# Patient Record
Sex: Female | Born: 1990 | Race: White | Hispanic: No | Marital: Married | Smoking: Never smoker
Health system: Southern US, Community
[De-identification: ages and names within clinical notes are randomized; demographics above are authoritative.]

## PROBLEM LIST (undated history)

## (undated) DIAGNOSIS — F319 Bipolar disorder, unspecified: Secondary | ICD-10-CM

## (undated) HISTORY — PX: WISDOM TOOTH EXTRACTION: SHX21

## (undated) HISTORY — DX: Bipolar disorder, unspecified: F31.9

---
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
## 2009-02-25 ENCOUNTER — Encounter: Admission: RE | Admit: 2009-02-25 | Discharge: 2009-02-25 | Payer: Self-pay | Admitting: Pediatrics

## 2009-05-06 ENCOUNTER — Ambulatory Visit: Payer: Self-pay | Admitting: Women's Health

## 2010-05-07 ENCOUNTER — Ambulatory Visit
Admission: RE | Admit: 2010-05-07 | Discharge: 2010-05-07 | Payer: Self-pay | Source: Home / Self Care | Attending: Women's Health | Admitting: Women's Health

## 2010-07-29 ENCOUNTER — Ambulatory Visit (INDEPENDENT_AMBULATORY_CARE_PROVIDER_SITE_OTHER): Payer: Private Health Insurance - Indemnity | Admitting: Women's Health

## 2010-07-29 DIAGNOSIS — R82998 Other abnormal findings in urine: Secondary | ICD-10-CM

## 2010-07-29 DIAGNOSIS — N390 Urinary tract infection, site not specified: Secondary | ICD-10-CM

## 2011-01-20 LAB — DIFFERENTIAL
Basophils Relative: 0
Eosinophils Absolute: 0
Lymphocytes Relative: 22 — ABNORMAL LOW
Lymphs Abs: 1.6
Neutro Abs: 5.3

## 2011-01-20 LAB — URINALYSIS, ROUTINE W REFLEX MICROSCOPIC
Bilirubin Urine: NEGATIVE
Hgb urine dipstick: NEGATIVE
Ketones, ur: >80 — AB
Protein, ur: NEGATIVE
Specific Gravity, Urine: 1.028

## 2011-01-20 LAB — I-STAT 8, (EC8 V) (CONVERTED LAB)
BUN: 8
Chloride: 106
Potassium: 4.4
Sodium: 137
pH, Ven: 7.385 — ABNORMAL HIGH

## 2011-01-20 LAB — RAPID URINE DRUG SCREEN, HOSP PERFORMED
Barbiturates: NOT DETECTED
Cocaine: NOT DETECTED
Opiates: NOT DETECTED

## 2011-01-20 LAB — CBC
MCV: 82.1
Platelets: 249
RBC: 4.47
RDW: 14.5

## 2011-01-20 LAB — POCT PREGNANCY, URINE
Operator id: 294501
Preg Test, Ur: NEGATIVE

## 2011-01-20 LAB — POCT I-STAT CREATININE: Operator id: 294501

## 2011-01-20 LAB — ETHANOL: Alcohol, Ethyl (B): <5

## 2011-05-09 ENCOUNTER — Encounter: Payer: Self-pay | Admitting: Women's Health

## 2011-05-09 ENCOUNTER — Ambulatory Visit (INDEPENDENT_AMBULATORY_CARE_PROVIDER_SITE_OTHER): Payer: Private Health Insurance - Indemnity | Admitting: Women's Health

## 2011-05-09 ENCOUNTER — Other Ambulatory Visit (HOSPITAL_COMMUNITY)
Admission: RE | Admit: 2011-05-09 | Discharge: 2011-05-09 | Disposition: A | Discharge status: Home / Self Care | Payer: Private Health Insurance - Indemnity | Source: Ambulatory Visit | Attending: Obstetrics and Gynecology | Admitting: Obstetrics and Gynecology

## 2011-05-09 VITALS — BP 102/74 | Ht 63.5 in | Wt 114.0 lb

## 2011-05-09 DIAGNOSIS — Z01419 Encounter for gynecological examination (general) (routine) without abnormal findings: Secondary | ICD-10-CM

## 2011-05-09 DIAGNOSIS — Z309 Encounter for contraceptive management, unspecified: Secondary | ICD-10-CM

## 2011-05-09 DIAGNOSIS — IMO0001 Reserved for inherently not codable concepts without codable children: Secondary | ICD-10-CM

## 2011-05-09 DIAGNOSIS — Z113 Encounter for screening for infections with a predominantly sexual mode of transmission: Secondary | ICD-10-CM

## 2011-05-09 DIAGNOSIS — E079 Disorder of thyroid, unspecified: Secondary | ICD-10-CM

## 2011-05-09 LAB — URINALYSIS
Glucose, UA: NEGATIVE mg/dL
Leukocytes, UA: NEGATIVE
Protein, ur: 100 mg/dL — AB
Urobilinogen, UA: 0.2 mg/dL (ref 0.0–1.0)
pH: 5.5 (ref 5.0–8.0)

## 2011-05-09 MED ORDER — NORGESTIM-ETH ESTRAD TRIPHASIC 0.18/0.215/0.25 MG-35 MCG PO TABS: 1.0000 | ORAL_TABLET | Freq: Every day | ORAL | Status: DC

## 2011-05-09 MED ORDER — NORGESTIMATE-ETH ESTRADIOL 0.25-35 MG-MCG PO TABS: 1.0000 | ORAL_TABLET | Freq: Every day | ORAL | Status: DC

## 2011-05-09 NOTE — Progress Notes (Signed)
Angela Robinson April 19, 1991 [id number]    History:    The patient presents for annual exam.  Monthly 7 day cycle on Ortho-Cyclen without complaint. New partner. Gardasil completed in 09. Bipolar does see a psychiatrist Dr. Tomasa Rand for medication and counseling.   Past medical history, past surgical history, family history and social history were all reviewed and documented in the EPIC chart.   ROS:  A  ROS was performed and pertinent positives and negatives are included in the history.  Exam:  Filed Vitals:   05/09/11 0903  BP: 102/74    General appearance:  Normal Head/Neck:  Normal, without cervical or supraclavicular adenopathy. Thyroid:  Symmetrical, normal in size, without palpable masses or nodularity. Respiratory  Effort:  Normal  Auscultation:  Clear without wheezing or rhonchi Cardiovascular  Auscultation:  Regular rate, without rubs, murmurs or gallops  Edema/varicosities:  Not grossly evident Abdominal  Soft,nontender, without masses, guarding or rebound.  Liver/spleen:  No organomegaly noted  Hernia:  None appreciated  Skin  Inspection:  Grossly normal  Palpation:  Grossly normal Neurologic/psychiatric  Orientation:  Normal with appropriate conversation.  Mood/affect:  Normal  Genitourinary    Breasts: Examined lying and sitting.     Right: Without masses, retractions, discharge or axillary adenopathy.     Left: Without masses, retractions, discharge or axillary adenopathy.   Inguinal/mons:  Normal without inguinal adenopathy  External genitalia:  Normal  BUS/Urethra/Skene's glands:  Normal  Bladder:  Normal  Vagina:  Normal  Cervix:  Normal  Uterus:  normal in size, shape and contour.  Midline and mobile  Adnexa/parametria:     Rt: Without masses or tenderness.   Lt: Without masses or tenderness.  Anus and perineum: Normal  Digital rectal exam:   Assessment/Plan:  21 y.o. SWF G0  for annual exam.    Normal GYN exam on Ortho-Cyclen STD  screen Bipolar/Dr. Tomasa Rand for meds and counseling  Plan: CBC, TSH, UA and Pap, GC/chlamydia declined HIV hepatitis or  RPR. SBEs, exercise, calcium rich diet, MVI daily encouraged. Ortho-Cyclen prescription, proper use, slight risk for blood clots and strokes reviewed. Condoms encouraged until permanent partner.    Harrington Challenger [facility], 9:35 AM 05/09/2011

## 2011-05-10 LAB — CBC WITH DIFFERENTIAL/PLATELET
Eosinophils Absolute: 0.2 10*3/uL (ref 0.0–0.7)
Eosinophils Relative: 4 % (ref 0–5)
Hemoglobin: 12.6 g/dL (ref 12.0–15.0)
Lymphs Abs: 2.2 10*3/uL (ref 0.7–4.0)
MCV: 92.1 fL (ref 78.0–100.0)
Monocytes Absolute: 0.5 10*3/uL (ref 0.1–1.0)
Monocytes Relative: 10 % (ref 3–12)
Neutro Abs: 1.8 10*3/uL (ref 1.7–7.7)
Platelets: 139 10*3/uL — ABNORMAL LOW (ref 150–400)
RBC: 4.28 MIL/uL (ref 3.87–5.11)
RDW: 12.8 % (ref 11.5–15.5)
WBC: 4.7 10*3/uL (ref 4.0–10.5)

## 2011-05-10 LAB — TSH: TSH: 7.831 u[IU]/mL — ABNORMAL HIGH (ref 0.350–4.500)

## 2011-05-11 ENCOUNTER — Telehealth: Payer: Self-pay | Admitting: *Deleted

## 2011-05-11 NOTE — Telephone Encounter (Signed)
Telephone call to Coward and her mother. Did review mild scoliosis. Reviewed importance of core exercises, good posture. Is having no problems at this time.

## 2011-05-11 NOTE — Telephone Encounter (Signed)
Pt mother called and would like to know what she can do about her daughter having scoliosis? Pt mother said you told her that Angela Robinson has this please advise

## 2011-05-12 ENCOUNTER — Other Ambulatory Visit: Payer: Self-pay | Admitting: *Deleted

## 2011-05-13 ENCOUNTER — Other Ambulatory Visit: Payer: Self-pay | Admitting: *Deleted

## 2011-05-16 ENCOUNTER — Other Ambulatory Visit: Payer: Self-pay | Admitting: *Deleted

## 2011-05-16 DIAGNOSIS — E0590 Thyrotoxicosis, unspecified without thyrotoxic crisis or storm: Secondary | ICD-10-CM

## 2011-05-16 DIAGNOSIS — D696 Thrombocytopenia, unspecified: Secondary | ICD-10-CM

## 2011-05-17 ENCOUNTER — Other Ambulatory Visit: Payer: Private Health Insurance - Indemnity

## 2011-05-17 DIAGNOSIS — D696 Thrombocytopenia, unspecified: Secondary | ICD-10-CM

## 2011-05-17 DIAGNOSIS — E0590 Thyrotoxicosis, unspecified without thyrotoxic crisis or storm: Secondary | ICD-10-CM

## 2011-05-18 LAB — THYROID PANEL WITH TSH
T3 Uptake: 31.1 % (ref 22.5–37.0)
T4, Total: 9.9 ug/dL (ref 5.0–12.5)
TSH: 5.295 u[IU]/mL — ABNORMAL HIGH (ref 0.350–4.500)

## 2011-05-18 LAB — CBC WITH DIFFERENTIAL/PLATELET
Eosinophils Absolute: 0.1 10*3/uL (ref 0.0–0.7)
Eosinophils Relative: 3 % (ref 0–5)
Monocytes Absolute: 0.4 10*3/uL (ref 0.1–1.0)
Neutro Abs: 1.5 10*3/uL — ABNORMAL LOW (ref 1.7–7.7)
WBC: 3.9 10*3/uL — ABNORMAL LOW (ref 4.0–10.5)

## 2011-05-26 ENCOUNTER — Telehealth: Payer: Self-pay | Admitting: Women's Health

## 2011-05-26 DIAGNOSIS — E039 Hypothyroidism, unspecified: Secondary | ICD-10-CM

## 2011-05-26 MED ORDER — LEVOTHYROXINE SODIUM 50 MCG PO TABS: 50.0000 ug | ORAL_TABLET | Freq: Every day | ORAL | Status: DC

## 2011-05-26 NOTE — Telephone Encounter (Signed)
TSH elevated at 5.29 and 7.81 with a normal T3 T4.  Reviewed needs Synthroid 50 daily, reviewed proper admin.  Rx called in and instructed to return in 6 weeks for TSH

## 2011-06-29 ENCOUNTER — Ambulatory Visit: Payer: Private Health Insurance - Indemnity

## 2011-06-29 ENCOUNTER — Other Ambulatory Visit: Payer: Private Health Insurance - Indemnity

## 2011-06-29 LAB — TSH: TSH: 2.366 u[IU]/mL (ref 0.350–4.500)

## 2011-07-01 ENCOUNTER — Other Ambulatory Visit: Payer: Self-pay | Admitting: *Deleted

## 2011-07-01 DIAGNOSIS — E039 Hypothyroidism, unspecified: Secondary | ICD-10-CM

## 2011-07-01 MED ORDER — LEVOTHYROXINE SODIUM 50 MCG PO TABS: 50.0000 ug | ORAL_TABLET | Freq: Every day | ORAL | Status: DC

## 2011-07-04 ENCOUNTER — Ambulatory Visit: Payer: Private Health Insurance - Indemnity

## 2011-09-30 ENCOUNTER — Emergency Department: Payer: Self-pay | Admitting: Emergency Medicine

## 2011-09-30 LAB — WET PREP, GENITAL

## 2011-09-30 LAB — COMPREHENSIVE METABOLIC PANEL
Albumin: 3 g/dL — ABNORMAL LOW (ref 3.4–5.0)
BUN: 14 mg/dL (ref 7–18)
Calcium, Total: 8.7 mg/dL (ref 8.5–10.1)
EGFR (African American): >60
Potassium: 3.5 mmol/L (ref 3.5–5.1)
Sodium: 140 mmol/L (ref 136–145)
Total Protein: 7 g/dL (ref 6.4–8.2)

## 2011-09-30 LAB — URINALYSIS, COMPLETE
Bacteria: NONE SEEN
Bilirubin,UR: NEGATIVE
Blood: NEGATIVE
Protein: NEGATIVE
RBC,UR: <1 /HPF (ref 0–5)
WBC UR: 1 /HPF (ref 0–5)

## 2011-09-30 LAB — CBC
HGB: 12.3 g/dL (ref 12.0–16.0)
MCH: 28.7 pg (ref 26.0–34.0)
MCHC: 32.5 g/dL (ref 32.0–36.0)
MCV: 88 fL (ref 80–100)
Platelet: 156 10*3/uL (ref 150–440)
RBC: 4.3 10*6/uL (ref 3.80–5.20)

## 2011-09-30 LAB — LIPASE, BLOOD: Lipase: 90 U/L (ref 73–393)

## 2011-09-30 LAB — PREGNANCY, URINE: Pregnancy Test, Urine: NEGATIVE m[IU]/mL

## 2011-10-03 ENCOUNTER — Encounter: Payer: Self-pay | Admitting: Women's Health

## 2011-10-03 ENCOUNTER — Ambulatory Visit (INDEPENDENT_AMBULATORY_CARE_PROVIDER_SITE_OTHER): Payer: Private Health Insurance - Indemnity | Admitting: Women's Health

## 2011-10-03 DIAGNOSIS — R102 Pelvic and perineal pain: Secondary | ICD-10-CM

## 2011-10-03 DIAGNOSIS — N949 Unspecified condition associated with female genital organs and menstrual cycle: Secondary | ICD-10-CM

## 2011-10-03 DIAGNOSIS — E039 Hypothyroidism, unspecified: Secondary | ICD-10-CM

## 2011-10-03 NOTE — Progress Notes (Signed)
Patient ID: Angela Robinson, female   DOB: [birth year], 21 y.o.   MRN: [id number] Presents for followup. Was seen at [facility] on 09/30/11 for pelvic pain. Had a normal CBC, UA, pelvic ultrasound, and pelvic CT scan. Was diagnosed with PID prescribed doxycycline 100 mg twice a day for 14 days, Zofran 4mg  when necessary, Motrin 800 when necessary for pain. At annual exam here she had negative gonorrhea/chlamydia cultures January 2013. Same partner, lives with partner. Contraceptives on Sprintec, no missed doses. Was instructed to followup due to a questionable cervical polyp on exam. Diagnosed with hypothyroidism in January 2013 has been on Synthroid 50 mcg daily. States also has problems with constipation.  Exam: Abdomen soft nontender, external genitalia within normal limits, speculum exam cervix red with no visible polyp noted. No discharge or odor noted. Minimal discomfort with exam.  Plan: TSH, triage based on results. Instructed to take doxycycline as prescribed, take after a meal, use Zofran as needed. Instructed to call [facility] for final lab results. Plan discussed with mother also. Several sample packs of MiraLax given with instructions. Instructed to increase fiber rich foods and fluids in diet.

## 2011-10-05 ENCOUNTER — Other Ambulatory Visit: Payer: Self-pay | Admitting: *Deleted

## 2011-10-05 ENCOUNTER — Telehealth: Payer: Self-pay | Admitting: *Deleted

## 2011-10-05 DIAGNOSIS — E039 Hypothyroidism, unspecified: Secondary | ICD-10-CM

## 2011-10-05 MED ORDER — LEVOTHYROXINE SODIUM 50 MCG PO TABS: 50.0000 ug | ORAL_TABLET | Freq: Every day | ORAL | Status: DC

## 2011-10-05 NOTE — Telephone Encounter (Signed)
Pt mother would like you to call Angela Robinson [phone] pt has been sick while taking doxycycline Rx, I asked mother has pt taken zofran, mother said she wasn't sure. I spoke with mother about tsh result note and synthroid 50 mg has been sent to pharmacy.

## 2011-10-05 NOTE — Telephone Encounter (Signed)
Telephone call to review problem. States took doxycycline yesterday without vomiting. Took this morning without Zofran vomited x1. Instructed to take Zofran 30 minutes prior to the doxycycline this evening and call if continued problems with nausea and vomiting. Reviewed importance of Synthroid. Instructed to take daily return to office in 6 weeks for recheck of TSH.

## 2011-11-17 ENCOUNTER — Encounter: Payer: Self-pay | Admitting: Gynecology

## 2011-11-17 ENCOUNTER — Ambulatory Visit (INDEPENDENT_AMBULATORY_CARE_PROVIDER_SITE_OTHER): Payer: Private Health Insurance - Indemnity | Admitting: Gynecology

## 2011-11-17 VITALS — BP 116/72

## 2011-11-17 DIAGNOSIS — N898 Other specified noninflammatory disorders of vagina: Secondary | ICD-10-CM

## 2011-11-17 DIAGNOSIS — Z8742 Personal history of other diseases of the female genital tract: Secondary | ICD-10-CM

## 2011-11-17 DIAGNOSIS — N760 Acute vaginitis: Secondary | ICD-10-CM

## 2011-11-17 DIAGNOSIS — Z113 Encounter for screening for infections with a predominantly sexual mode of transmission: Secondary | ICD-10-CM

## 2011-11-17 DIAGNOSIS — B373 Candidiasis of vulva and vagina: Secondary | ICD-10-CM

## 2011-11-17 LAB — WET PREP FOR TRICH, YEAST, CLUE: Clue Cells Wet Prep HPF POC: NONE SEEN

## 2011-11-17 MED ORDER — FLUCONAZOLE 150 MG PO TABS: 150.0000 mg | ORAL_TABLET | Freq: Once | ORAL | Status: DC

## 2011-11-17 MED ORDER — FLUCONAZOLE 100 MG PO TABS: 100.0000 mg | ORAL_TABLET | Freq: Every day | ORAL | Status: DC

## 2011-11-17 NOTE — Addendum Note (Signed)
Addended by: Dara Lords on: 11/17/2011 05:58 PM   Modules accepted: Orders

## 2011-11-17 NOTE — Progress Notes (Signed)
Patient 21 year old who presented to the office complaining of vulvovaginal irritation pruritus. She stated that in June of this year she was seen at [facility] and was treated for PID and received an injection and was given antibiotics to take for a week to 10 days post discharge. Her cycles are otherwise regular she's a monogamous relationship and she is on oral contraceptive pills. She stated she was having a white-like discharge as well. Patient has no prior history of STD prior to visit in June. She stated she's never had GC and chlamydia in the past.  Exam: Bartholin urethra Skene were: External genital irritation and erythema and some superficial mucosal cuts. Vagina: thick white discharge Cervix: No lesions or discharge  Wet prep moderate amount of yeast and white blood cells and bacteria.  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 Patient will be placed on Diflucan 150 mg once a day and to repeat in 3 days. She was given an extra tablet in case it was still persistent after that. GC and Chlamydia culture was obtained results pending at time of this dictation.

## 2011-11-17 NOTE — Patient Instructions (Signed)
Monilial Vaginitis Vaginitis in a soreness, swelling and redness (inflammation) of the vagina and vulva. Monilial vaginitis is not a sexually transmitted infection. CAUSES  Yeast vaginitis is caused by yeast (candida) that is normally found in your vagina. With a yeast infection, the candida has overgrown in number to a point that upsets the chemical balance. SYMPTOMS   White, thick vaginal discharge.   Swelling, itching, redness and irritation of the vagina and possibly the lips of the vagina (vulva).   Burning or painful urination.   Painful intercourse.  DIAGNOSIS  Things that may contribute to monilial vaginitis are:  Postmenopausal and virginal states.   Pregnancy.   Infections.   Being tired, sick or stressed, especially if you had monilial vaginitis in the past.   Diabetes. Good control will help lower the chance.   Birth control pills.   Tight fitting garments.   Using bubble bath, feminine sprays, douches or deodorant tampons.   Taking certain medications that kill germs (antibiotics).   Sporadic recurrence can occur if you become ill.  TREATMENT  Your caregiver will give you medication.  There are several kinds of anti monilial vaginal creams and suppositories specific for monilial vaginitis. For recurrent yeast infections, use a suppository or cream in the vagina 2 times a week, or as directed.   Anti-monilial or steroid cream for the itching or irritation of the vulva may also be used. Get your caregiver's permission.   Painting the vagina with methylene blue solution may help if the monilial cream does not work.   Eating yogurt may help prevent monilial vaginitis.  HOME CARE INSTRUCTIONS   Finish all medication as prescribed.   Do not have sex until treatment is completed or after your caregiver tells you it is okay.   Take warm sitz baths.   Do not douche.   Do not use tampons, especially scented ones.   Wear cotton underwear.   Avoid tight  pants and panty hose.   Tell your sexual partner that you have a yeast infection. They should go to their caregiver if they have symptoms such as mild rash or itching.   Your sexual partner should be treated as well if your infection is difficult to eliminate.   Practice safer sex. Use condoms.   Some vaginal medications cause latex condoms to fail. Vaginal medications that harm condoms are:   Cleocin cream.   Butoconazole (Femstat).   Terconazole (Terazol) vaginal suppository.   Miconazole (Monistat) (may be purchased over the counter).  SEEK MEDICAL CARE IF:   You have a temperature by mouth above 102 F (38.9 C).   The infection is getting worse after 2 days of treatment.   The infection is not getting better after 3 days of treatment.   You develop blisters in or around your vagina.   You develop vaginal bleeding, and it is not your menstrual period.   You have pain when you urinate.   You develop intestinal problems.   You have pain with sexual intercourse.  Document Released: 01/26/2005 Document Revised: 04/07/2011 Document Reviewed: 10/10/2008 ExitCare Patient Information 2012 [employer]. 

## 2011-11-18 ENCOUNTER — Other Ambulatory Visit: Payer: Self-pay | Admitting: Gynecology

## 2011-11-18 LAB — GC/CHLAMYDIA PROBE AMP, GENITAL
Chlamydia, DNA Probe: NEGATIVE
GC Probe Amp, Genital: NEGATIVE

## 2011-11-18 MED ORDER — FLUCONAZOLE 150 MG PO TABS: ORAL_TABLET | ORAL | Status: DC

## 2011-11-21 ENCOUNTER — Ambulatory Visit: Payer: Private Health Insurance - Indemnity | Admitting: Women's Health

## 2011-11-29 ENCOUNTER — Telehealth: Payer: Self-pay | Admitting: *Deleted

## 2011-11-29 DIAGNOSIS — E039 Hypothyroidism, unspecified: Secondary | ICD-10-CM

## 2011-11-29 MED ORDER — LEVOTHYROXINE SODIUM 50 MCG PO TABS: 50.0000 ug | ORAL_TABLET | Freq: Every day | ORAL | Status: DC

## 2011-11-29 NOTE — Telephone Encounter (Signed)
Mother called to say dog ate the rest of the patients TSH meds. Mother wanted to get another refill said patient had missed pills for a few days.  Told mother we would call in refill and let patient take for a couple weeks then come back for her TSH check then.

## 2011-11-30 ENCOUNTER — Other Ambulatory Visit: Payer: Private Health Insurance - Indemnity

## 2012-02-10 ENCOUNTER — Other Ambulatory Visit: Payer: Self-pay | Admitting: Women's Health

## 2012-02-10 NOTE — Telephone Encounter (Signed)
Left message for patient that [place] refilled her medication but I need her to call me to speak with her about arranging to have her TSH re-checked.

## 2012-02-10 NOTE — Telephone Encounter (Signed)
Please call [name] and have her get a TSH checked also, did approve the refill.

## 2012-02-13 NOTE — Telephone Encounter (Signed)
Left message to call to arrange to have her thyroid rechecked.

## 2012-05-09 ENCOUNTER — Ambulatory Visit (INDEPENDENT_AMBULATORY_CARE_PROVIDER_SITE_OTHER): Payer: Private Health Insurance - Indemnity | Admitting: Women's Health

## 2012-05-09 ENCOUNTER — Encounter: Payer: Self-pay | Admitting: Women's Health

## 2012-05-09 VITALS — BP 112/64 | Ht 64.0 in | Wt 115.0 lb

## 2012-05-09 DIAGNOSIS — Z01419 Encounter for gynecological examination (general) (routine) without abnormal findings: Secondary | ICD-10-CM

## 2012-05-09 DIAGNOSIS — E039 Hypothyroidism, unspecified: Secondary | ICD-10-CM

## 2012-05-09 LAB — CBC WITH DIFFERENTIAL/PLATELET
Basophils Relative: 0 % (ref 0–1)
HCT: 35.9 % — ABNORMAL LOW (ref 36.0–46.0)
Hemoglobin: 12.3 g/dL (ref 12.0–15.0)
Lymphocytes Relative: 19 % (ref 12–46)
Lymphs Abs: 0.9 10*3/uL (ref 0.7–4.0)
MCHC: 34.3 g/dL (ref 30.0–36.0)
Monocytes Absolute: 0.4 10*3/uL (ref 0.1–1.0)
Monocytes Relative: 9 % (ref 3–12)
Neutro Abs: 3.5 10*3/uL (ref 1.7–7.7)
Neutrophils Relative %: 72 % (ref 43–77)
RBC: 4.12 MIL/uL (ref 3.87–5.11)

## 2012-05-09 LAB — TSH: TSH: 0.893 u[IU]/mL (ref 0.350–4.500)

## 2012-05-09 MED ORDER — NORGESTIMATE-ETH ESTRADIOL 0.25-35 MG-MCG PO TABS: 1.0000 | ORAL_TABLET | Freq: Every day | ORAL | Status: DC

## 2012-05-09 MED ORDER — LEVOTHYROXINE SODIUM 50 MCG PO TABS: 50.0000 ug | ORAL_TABLET | Freq: Every day | ORAL | Status: DC

## 2012-05-09 NOTE — Progress Notes (Signed)
Angela Robinson 05-13-1990 [id number]    History:    The patient presents for annual exam.  Regular monthly cycle on Ortho-Cyclen without complaint. Gardasil series completed in 2009. Paps normal 2012. Hypothyroid on Synthroid 50 mcg. Reports not taking for about 2 weeks/ has been sick with a cold. Bipolar, Dr. Tomasa Rand manages with meds.   Past medical history, past surgical history, family history and social history were all reviewed and documented in the EPIC chart. 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   ROS:  A  ROS was performed and pertinent positives and negatives are included in the history.  Exam:  Filed Vitals:   05/09/12 0804  BP: 112/64    General appearance:  Normal Head/Neck:  Normal, without cervical or supraclavicular adenopathy. Thyroid:  Symmetrical, normal in size, without palpable masses or nodularity. Respiratory  Effort:  Normal  Auscultation:  Clear without wheezing or rhonchi Cardiovascular  Auscultation:  Regular rate, without rubs, murmurs or gallops  Edema/varicosities:  Not grossly evident Abdominal  Soft,nontender, without masses, guarding or rebound.  Liver/spleen:  No organomegaly noted  Hernia:  None appreciated  Skin  Inspection:  Grossly normal  Palpation:  Grossly normal Neurologic/psychiatric  Orientation:  Normal with appropriate conversation.  Mood/affect:  Normal  Genitourinary    Breasts: Examined lying and sitting.     Right: Without masses, retractions, discharge or axillary adenopathy.     Left: Without masses, retractions, discharge or axillary adenopathy.   Inguinal/mons:  Normal without inguinal adenopathy  External genitalia:  Normal  BUS/Urethra/Skene's glands:  Normal  Bladder:  Normal  Vagina:  Normal  Cervix:  Normal  Uterus:  normal in size, shape and contour.  Midline and mobile  Adnexa/parametria:     Rt: Without masses or tenderness.   Lt: Without masses or tenderness.  Anus and  perineum: Normal   Assessment/Plan:  22 y.o. [name] for annual exam.     Normal GYN exam/Ortho-Cyclen Bipolar Dr. Tomasa Rand manages meds and counseling Hypothyroid/ Synthroid 50 mcg  Plan: Ortho-Cyclen prescription, proper use, slight risk for blood clots and strokes reviewed. SBE's, exercise, calcium rich diet, MVI daily encouraged. Synthroid 50 mcg by mouth daily, reviewed importance of taking daily. CBC, TSH, UA, Pap normal 2012, new screening guidelines reviewed.    Angela Robinson WHNP, 8:30 AM 05/09/2012

## 2012-05-09 NOTE — Patient Instructions (Addendum)

## 2012-05-10 LAB — URINALYSIS W MICROSCOPIC + REFLEX CULTURE
Bacteria, UA: NONE SEEN
Crystals: NONE SEEN
Leukocytes, UA: NEGATIVE
Protein, ur: 30 mg/dL — AB
Specific Gravity, Urine: 1.028 (ref 1.005–1.030)
pH: 6.5 (ref 5.0–8.0)

## 2012-07-24 ENCOUNTER — Telehealth: Payer: Self-pay

## 2012-07-24 NOTE — Telephone Encounter (Signed)
Pharmacy sent note stating that their supplier of Levothyroxin has changed from [employer] brand to [employer] brand.  They wanted to let you know and ask if okay to make this switch for patient.

## 2012-07-25 NOTE — Telephone Encounter (Signed)
Pharmacy informed.

## 2012-07-25 NOTE — Telephone Encounter (Signed)
Okay for change? °

## 2012-07-25 NOTE — Telephone Encounter (Signed)
Patient has been on the generic all along. They are just wanting you to know their manufacturer has changed to [employer].  Do you still want me to pursue the brand issue with her?

## 2012-07-25 NOTE — Telephone Encounter (Signed)
Branded  name is preferable if patient can afford. Can they order the other brand from another [facility]?  If not okay.

## 2012-12-19 ENCOUNTER — Ambulatory Visit (INDEPENDENT_AMBULATORY_CARE_PROVIDER_SITE_OTHER): Payer: Managed Care, Other (non HMO) | Admitting: Women's Health

## 2012-12-19 ENCOUNTER — Encounter: Payer: Self-pay | Admitting: Women's Health

## 2012-12-19 DIAGNOSIS — N898 Other specified noninflammatory disorders of vagina: Secondary | ICD-10-CM

## 2012-12-19 DIAGNOSIS — B379 Candidiasis, unspecified: Secondary | ICD-10-CM

## 2012-12-19 DIAGNOSIS — E039 Hypothyroidism, unspecified: Secondary | ICD-10-CM

## 2012-12-19 LAB — WET PREP FOR TRICH, YEAST, CLUE
Clue Cells Wet Prep HPF POC: NONE SEEN
Trich, Wet Prep: NONE SEEN
WBC, Wet Prep HPF POC: NONE SEEN

## 2012-12-19 MED ORDER — FLUCONAZOLE 150 MG PO TABS: ORAL_TABLET | ORAL | Status: DC

## 2012-12-19 NOTE — Progress Notes (Signed)
Patient ID: Angela Robinson, female   DOB: February 28, 1991, 22 y.o.   MRN: [id number] Presents with complaint of dyspareunia for several months with mild vaginal irritation, no noted discharge, some abdominal discomfort. Symptoms are vague, difficulty articulating discomfort. Denies urinary symptoms, fever or change in GI elimination. Same partner years.  Monthly cycle on Ortho-Cyclen. Hyperthyroid on 50 mcg of Synthroid.  Exam: Appears well. External genitalia erythematous at introitus, speculum exam vaginal walls are erythematous with white discharge, wet prep positive for moderate yeast. Bimanual no CMT or adnexal fullness or tenderness pain mostly at introitus and vaginal area.  Dyspareunia Yeast vaginitis Hypothyroid  Plan: Diflucan 150 by mouth today repeat in 3 days if needed. Prescription, proper use, yeast prevention discussed.  Instructed to call if no relief of dyspareunia. TSH pending.

## 2012-12-19 NOTE — Addendum Note (Signed)
Addended by: Richardson Chiquito on: 12/19/2012 03:57 PM   Modules accepted: Orders

## 2012-12-19 NOTE — Patient Instructions (Addendum)
Monilial Vaginitis  Vaginitis in a soreness, swelling and redness (inflammation) of the vagina and vulva. Monilial vaginitis is not a sexually transmitted infection.  CAUSES   Yeast vaginitis is caused by yeast (candida) that is normally found in your vagina. With a yeast infection, the candida has overgrown in number to a point that upsets the chemical balance.  SYMPTOMS   · White, thick vaginal discharge.  · Swelling, itching, redness and irritation of the vagina and possibly the lips of the vagina (vulva).  · Burning or painful urination.  · Painful intercourse.  DIAGNOSIS   Things that may contribute to monilial vaginitis are:  · Postmenopausal and virginal states.  · Pregnancy.  · Infections.  · Being tired, sick or stressed, especially if you had monilial vaginitis in the past.  · Diabetes. Good control will help lower the chance.  · Birth control pills.  · Tight fitting garments.  · Using bubble bath, feminine sprays, douches or deodorant tampons.  · Taking certain medications that kill germs (antibiotics).  · Sporadic recurrence can occur if you become ill.  TREATMENT   Your caregiver will give you medication.  · There are several kinds of anti monilial vaginal creams and suppositories specific for monilial vaginitis. For recurrent yeast infections, use a suppository or cream in the vagina 2 times a week, or as directed.  · Anti-monilial or steroid cream for the itching or irritation of the vulva may also be used. Get your caregiver's permission.  · Painting the vagina with methylene blue solution may help if the monilial cream does not work.  · Eating yogurt may help prevent monilial vaginitis.  HOME CARE INSTRUCTIONS   · Finish all medication as prescribed.  · Do not have sex until treatment is completed or after your caregiver tells you it is okay.  · Take warm sitz baths.  · Do not douche.  · Do not use tampons, especially scented ones.  · Wear cotton underwear.  · Avoid tight pants and panty  hose.  · Tell your sexual partner that you have a yeast infection. They should go to their caregiver if they have symptoms such as mild rash or itching.  · Your sexual partner should be treated as well if your infection is difficult to eliminate.  · Practice safer sex. Use condoms.  · Some vaginal medications cause latex condoms to fail. Vaginal medications that harm condoms are:  · Cleocin cream.  · Butoconazole (Femstat®).  · Terconazole (Terazol®) vaginal suppository.  · Miconazole (Monistat®) (may be purchased over the counter).  SEEK MEDICAL CARE IF:   · You have a temperature by mouth above 102° F (38.9° C).  · The infection is getting worse after 2 days of treatment.  · The infection is not getting better after 3 days of treatment.  · You develop blisters in or around your vagina.  · You develop vaginal bleeding, and it is not your menstrual period.  · You have pain when you urinate.  · You develop intestinal problems.  · You have pain with sexual intercourse.  Document Released: 01/26/2005 Document Revised: 07/11/2011 Document Reviewed: 10/10/2008  ExitCare® Patient Information ©2014 [employer].

## 2012-12-20 LAB — TSH: TSH: 3.05 u[IU]/mL (ref 0.350–4.500)

## 2013-05-10 ENCOUNTER — Encounter: Payer: Managed Care, Other (non HMO) | Admitting: Women's Health

## 2013-05-22 ENCOUNTER — Ambulatory Visit (INDEPENDENT_AMBULATORY_CARE_PROVIDER_SITE_OTHER): Payer: Managed Care, Other (non HMO) | Admitting: Women's Health

## 2013-05-22 ENCOUNTER — Encounter: Payer: Self-pay | Admitting: Women's Health

## 2013-05-22 VITALS — BP 110/70 | Ht 64.0 in | Wt 125.0 lb

## 2013-05-22 DIAGNOSIS — E039 Hypothyroidism, unspecified: Secondary | ICD-10-CM

## 2013-05-22 DIAGNOSIS — Z01419 Encounter for gynecological examination (general) (routine) without abnormal findings: Secondary | ICD-10-CM

## 2013-05-22 DIAGNOSIS — Z309 Encounter for contraceptive management, unspecified: Secondary | ICD-10-CM

## 2013-05-22 DIAGNOSIS — IMO0001 Reserved for inherently not codable concepts without codable children: Secondary | ICD-10-CM

## 2013-05-22 LAB — CBC WITH DIFFERENTIAL/PLATELET
BASOS PCT: 1 % (ref 0–1)
Basophils Absolute: 0 10*3/uL (ref 0.0–0.1)
EOS ABS: 0.1 10*3/uL (ref 0.0–0.7)
Eosinophils Relative: 2 % (ref 0–5)
HCT: 38.3 % (ref 36.0–46.0)
HEMOGLOBIN: 12.6 g/dL (ref 12.0–15.0)
Lymphocytes Relative: 54 % — ABNORMAL HIGH (ref 12–46)
Lymphs Abs: 2.3 10*3/uL (ref 0.7–4.0)
MCH: 29.9 pg (ref 26.0–34.0)
MCHC: 32.9 g/dL (ref 30.0–36.0)
MCV: 90.8 fL (ref 78.0–100.0)
MONOS PCT: 10 % (ref 3–12)
Monocytes Absolute: 0.4 10*3/uL (ref 0.1–1.0)
NEUTROS PCT: 33 % — AB (ref 43–77)
Neutro Abs: 1.5 10*3/uL — ABNORMAL LOW (ref 1.7–7.7)
Platelets: 182 10*3/uL (ref 150–400)
RBC: 4.22 MIL/uL (ref 3.87–5.11)
RDW: 13.5 % (ref 11.5–15.5)
WBC: 4.3 10*3/uL (ref 4.0–10.5)

## 2013-05-22 LAB — TSH: TSH: 5.842 u[IU]/mL — ABNORMAL HIGH (ref 0.350–4.500)

## 2013-05-22 MED ORDER — LEVOTHYROXINE SODIUM 50 MCG PO TABS: 50.0000 ug | ORAL_TABLET | Freq: Every day | ORAL | Status: DC

## 2013-05-22 MED ORDER — NORGESTIMATE-ETH ESTRADIOL 0.25-35 MG-MCG PO TABS: 1.0000 | ORAL_TABLET | Freq: Every day | ORAL | Status: DC

## 2013-05-22 NOTE — Patient Instructions (Signed)

## 2013-05-22 NOTE — Progress Notes (Signed)
Angela Robinson 05/23/1990 [id number]   History:    Presents for annual exam without complaint.  Monthly cycle/ Ortho-Cyclen/not sexually active. Gardasil series completed in 2009. History of normal paps. History of hypothyroidism/Synthroid 50 mcg. History of bipolar, Dr. Tomasa Robinson manages with meds.  Past medical history, past surgical history, family history and social history were all reviewed and documented in the EPIC chart. Works as a Child psychotherapistwaitress, lives at home. PGF, PGM heart disease and hyperlipidemia. MGM breast cancer in the 60s, DM. MGF DM  ROS:  A  ROS was performed and pertinent positives and negatives are included.  Exam:  Filed Vitals:   05/22/13 0806  BP: 110/70    General appearance:  Normal Thyroid:  Symmetrical, normal in size, without palpable masses or nodularity. Respiratory  Auscultation:  Clear without wheezing or rhonchi Cardiovascular  Auscultation:  Regular rate, without rubs, murmurs or gallops  Edema/varicosities:  Not grossly evident Abdominal  Soft,nontender, without masses, guarding or rebound.  Liver/spleen:  No organomegaly noted  Hernia:  None appreciated  Skin  Inspection:  Grossly normal   Breasts: Examined lying and sitting.     Right: Without masses, retractions, discharge or axillary adenopathy.     Left: Without masses, retractions, discharge or axillary adenopathy. Gentitourinary   Inguinal/mons:  Normal without inguinal adenopathy  External genitalia:  Normal  BUS/Urethra/Skene's glands:  Normal  Vagina:  Normal  Cervix:  Normal  Uterus:  Normal in size, shape and contour.  Midline and mobile  Adnexa/parametria:     Rt: Without masses or tenderness.   Lt: Without masses or tenderness.  Anus and perineum: Normal  Digital rectal exam: Normal sphincter tone without palpated masses or tenderness  Assessment/Plan:  23 y.o. SWF G0 for annual exam without complaint.  Normal GYN exam/Ortho-Cyclen Hypothyroid/ Synthroid 50 mcg Bipolar  Dr. Tomasa Robinson manages meds and counseling  Plan: Ortho-Cyclen prescription, slight risk for blood clots and strokes reviewed. Synthroid 50 mcg, once daily, prescription proper use given and reviewed. CBC, TSH, UA. Pap normal 2013, new screening guidelines reviewed.SBE's, controlling calories (10 pound weight gain from last year) exercise, calcium rich diet, MVI daily encouraged. Safe driving, dating reviewed. Condoms encouraged with new partners.    Angela Robinson [facility] JerseyWHNP, 8:35 AM 05/22/2013

## 2013-05-23 LAB — URINALYSIS W MICROSCOPIC + REFLEX CULTURE
BILIRUBIN URINE: NEGATIVE
Bacteria, UA: NONE SEEN
CRYSTALS: NONE SEEN
Casts: NONE SEEN
GLUCOSE, UA: NEGATIVE mg/dL
Hgb urine dipstick: NEGATIVE
Leukocytes, UA: NEGATIVE
Nitrite: NEGATIVE
PH: 7 (ref 5.0–8.0)
Protein, ur: NEGATIVE mg/dL
SPECIFIC GRAVITY, URINE: 1.028 (ref 1.005–1.030)
Urobilinogen, UA: 0.2 mg/dL (ref 0.0–1.0)

## 2013-05-29 ENCOUNTER — Other Ambulatory Visit: Payer: Self-pay | Admitting: Women's Health

## 2013-05-29 DIAGNOSIS — R7989 Other specified abnormal findings of blood chemistry: Secondary | ICD-10-CM

## 2014-04-15 IMAGING — CT CT ABD-PELV W/ CM
1 of 2 series · 15 of 32 positions shown, 19 images · non-contrast
Comparison: none

REASON FOR EXAM: (1) Abd pain, vomiting; (2) Abd pain, vomiting, no PO
contrast please
COMMENTS:

PROCEDURE:     CT  - CT ABDOMEN / PELVIS  W  - September 30, 2011  [DATE]
RESULT:     History: Pain and vomiting.
Impression study: No recent.

[Series 2: 3mm soft tissue · axial · 0.59mm/px · z∈[-653,-257]mm · 15 of 144 slices shown, 19 images]
[im 6/144  soft-tissue]
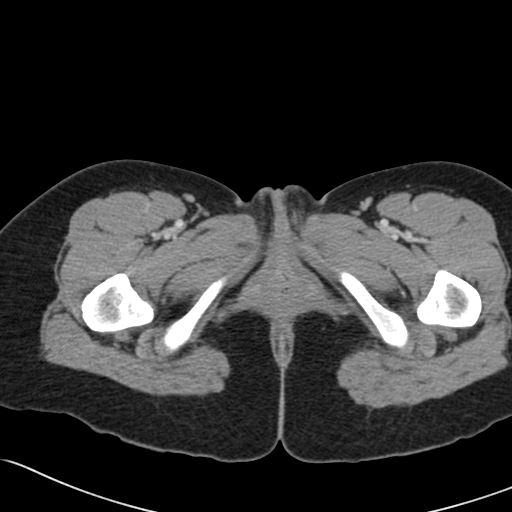
[im 6/144  bone]
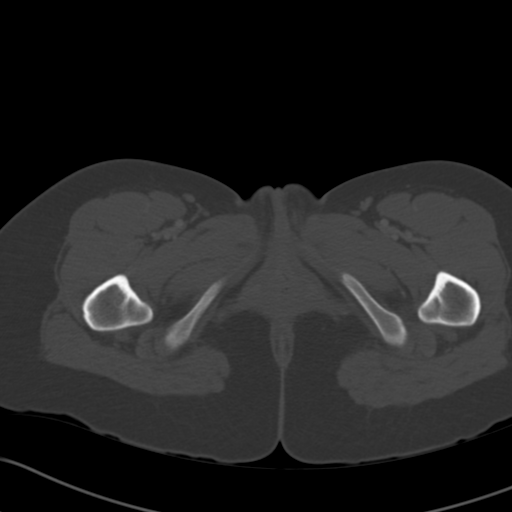
[im 18/144  soft-tissue]
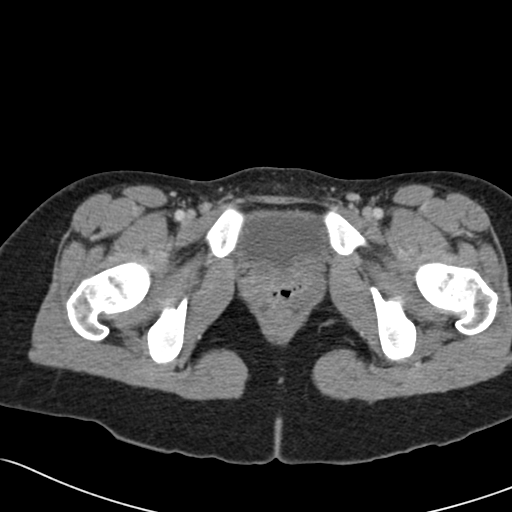
[im 29/144  soft-tissue]
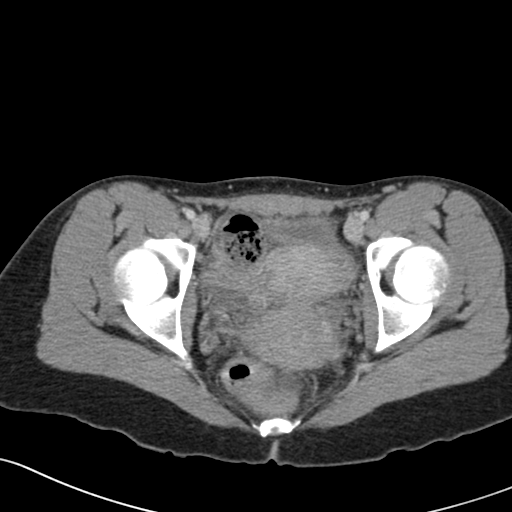
[im 41/144  soft-tissue]
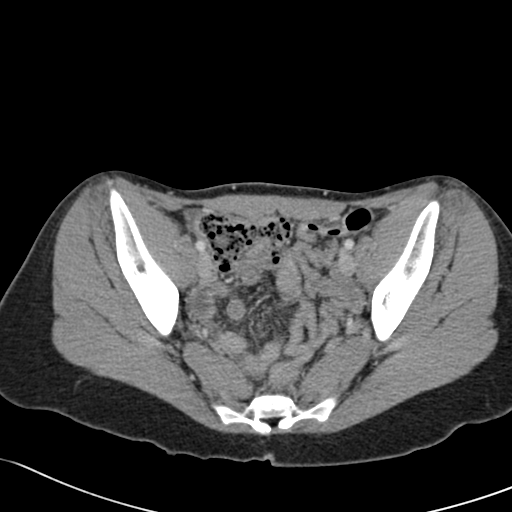
[im 52/144  soft-tissue]
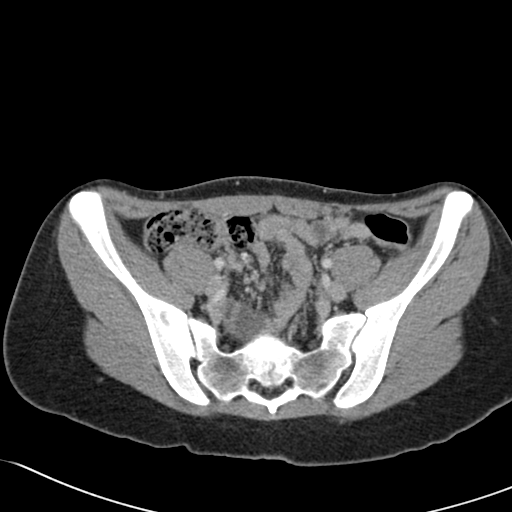
[im 63/144  soft-tissue]
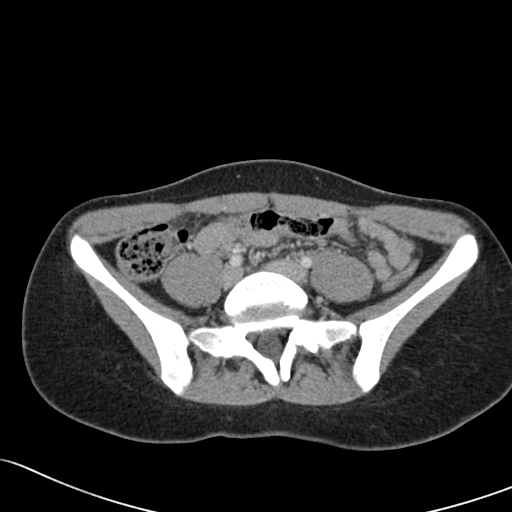
[im 75/144  soft-tissue]
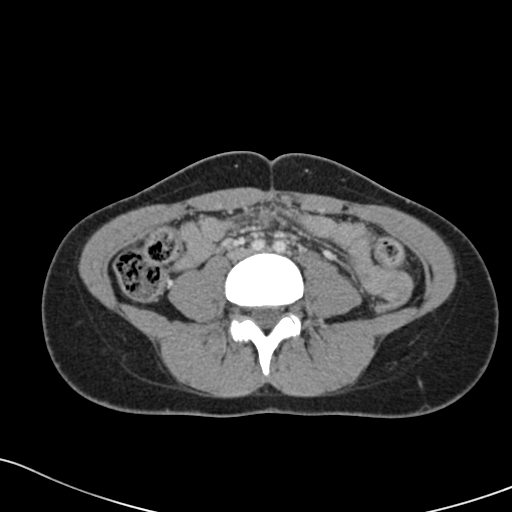
[im 81/144  soft-tissue]
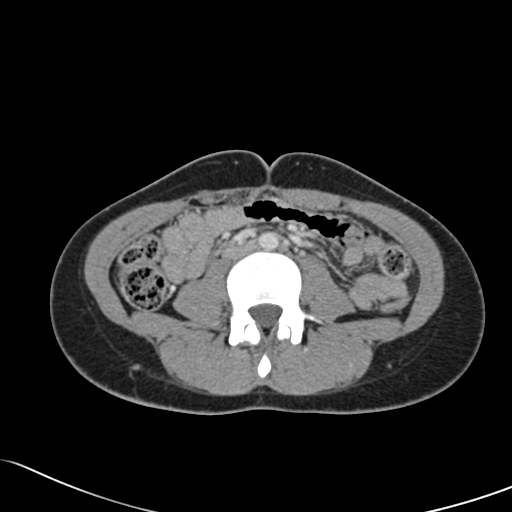
[im 92/144  soft-tissue]
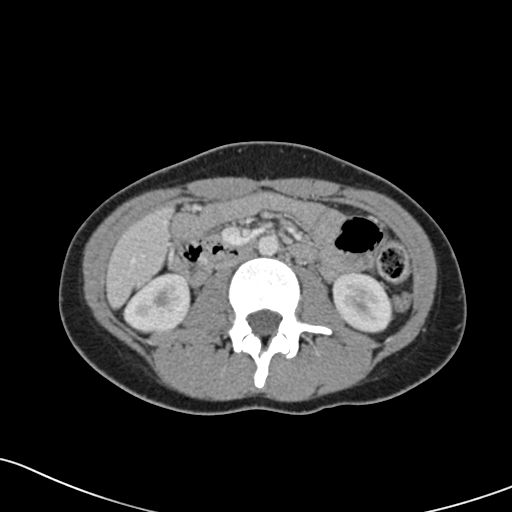
[im 92/144  bone]
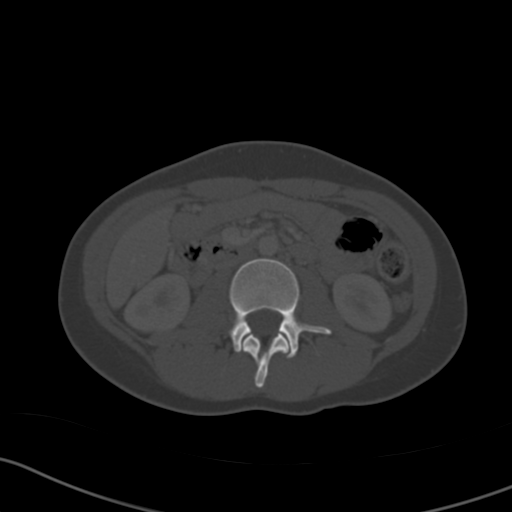
[im 103/144  soft-tissue]
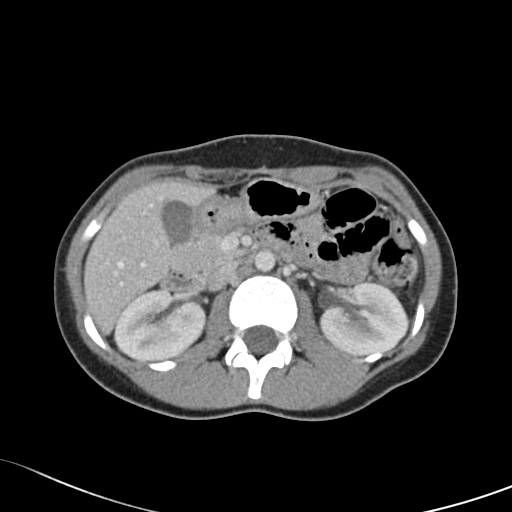
[im 115/144  soft-tissue]
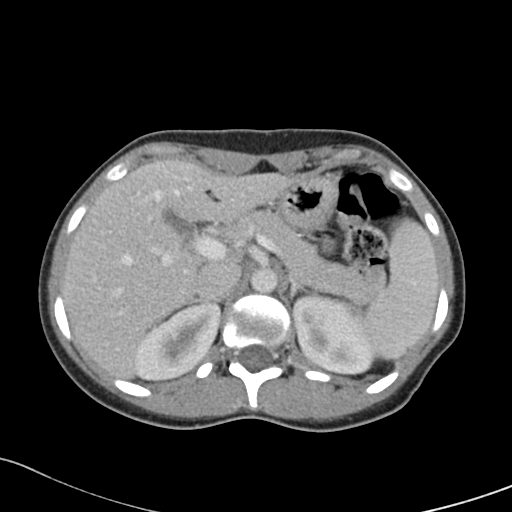
[im 121/144  lung]
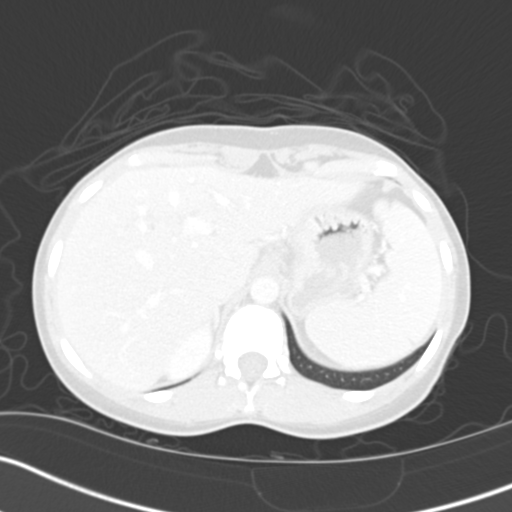
[im 126/144  soft-tissue]
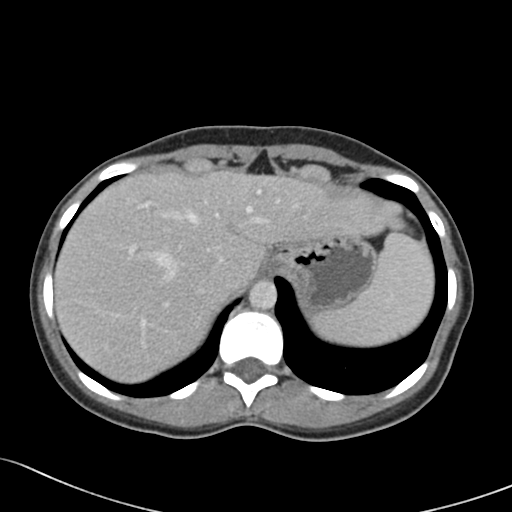
[im 126/144  lung]
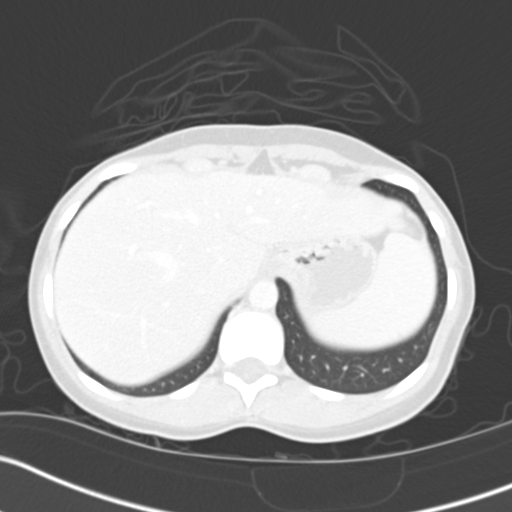
[im 132/144  lung]
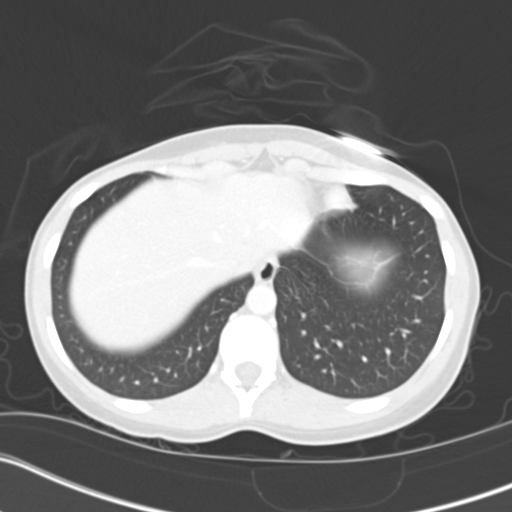
[im 138/144  soft-tissue]
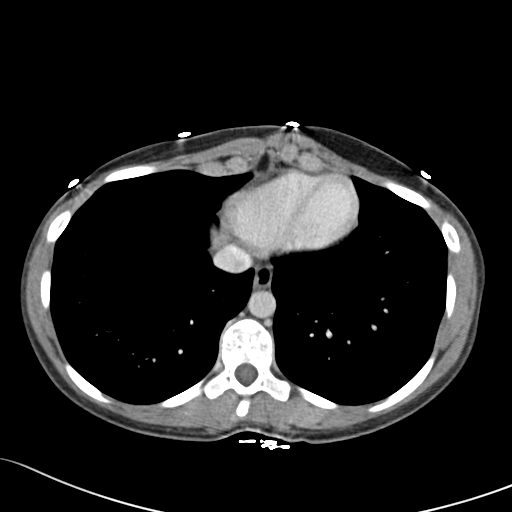
[im 138/144  lung]
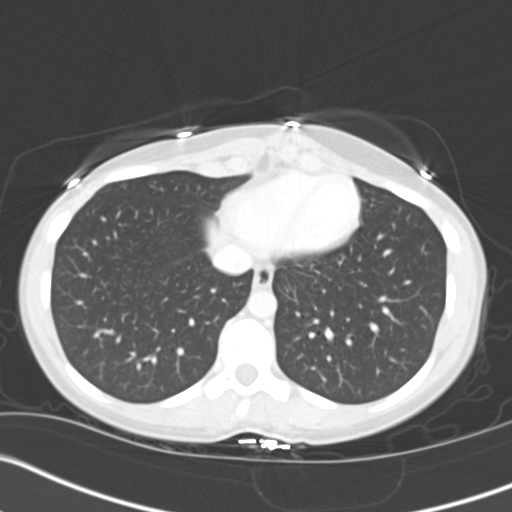

[15 of 32 positions shown; findings below may reference images not displayed]

FINDINGS: Standard CT obtained with 85 cc of Ksovue-JZZ. Lung bases clear.
No free air. Liver normal. Spleen normal. Pancreas normal. Adrenals normal.
Kidneys normal. Aorta nondistended. Free fluid is noted in the pelvis.
Cystic changes noted in the region of the ovaries. Appendix not definitely
visualized due to the fluid in the pelvis. No evidence of bowel obstruction.
Kidneys are normal. Aorta nondistended. Small amount of air noted in the
bladder.
IMPRESSION: Mild to moderate amount of free fluid in the pelvis. Cystic
changes and ovaries. Pregnancy test should be considered to exclude ectopic
pregnancy. Small amount of air noted in bladder. This could be from
instrumentation or infection.

## 2014-04-15 IMAGING — US TRANSABDOMINAL ULTRASOUND OF PELVIS
1 series · 14 of 25 positions shown · non-contrast
Comparison: none

REASON FOR EXAM: pain - left abd, adenxa
COMMENTS:

PROCEDURE:     US  - US PELVIS EXAM  - September 30, 2011  [DATE]
RESULT:     History: Pain.

[Series 1: transabdominal ultrasound of pelvis · 0.25mm/px · 14 of 45 slices shown]
[im 1/45]
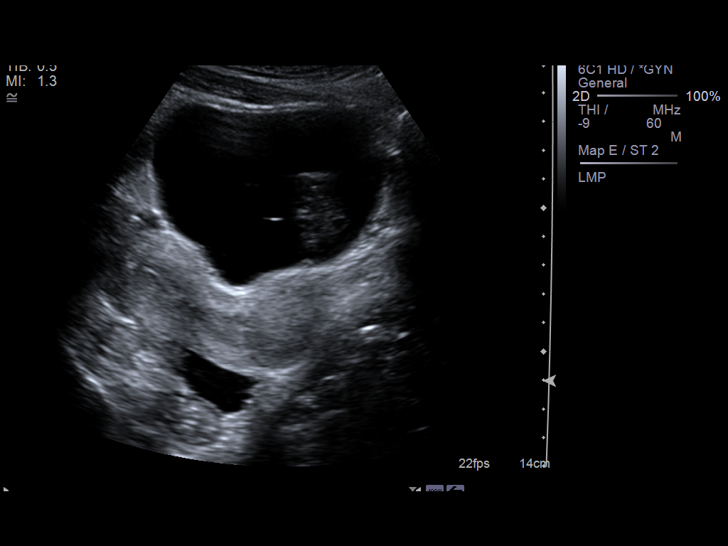
[im 4/45]
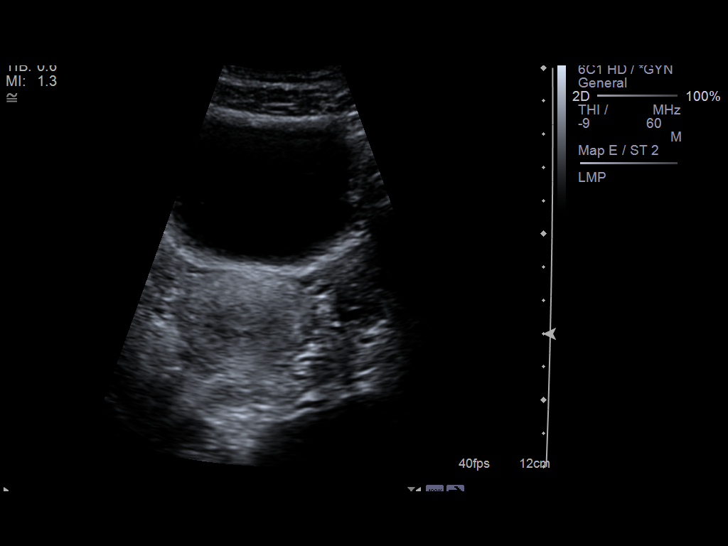
[im 8/45]
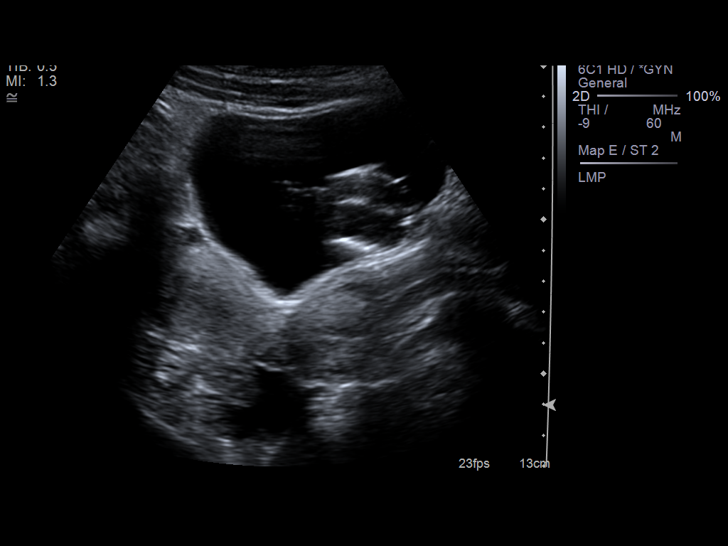
[im 12/45]
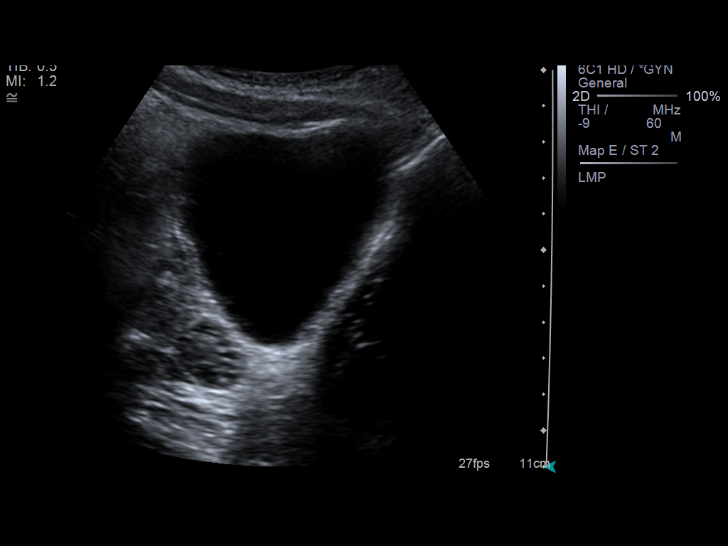
[im 15/45]
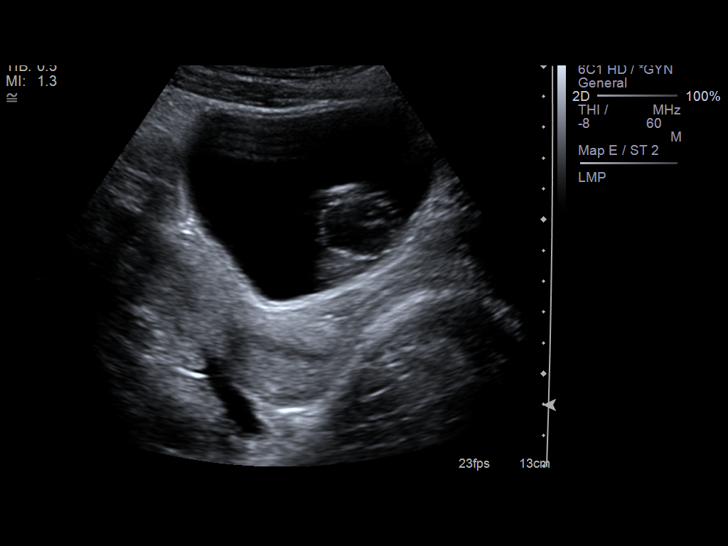
[im 17/45]
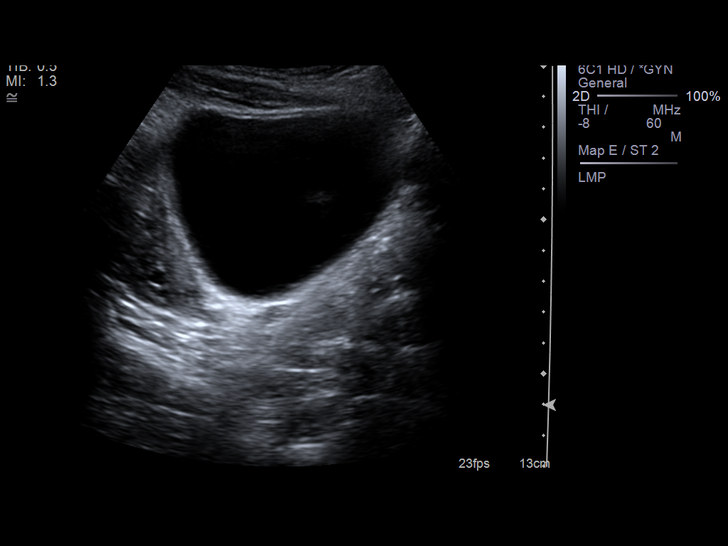
[im 21/45]
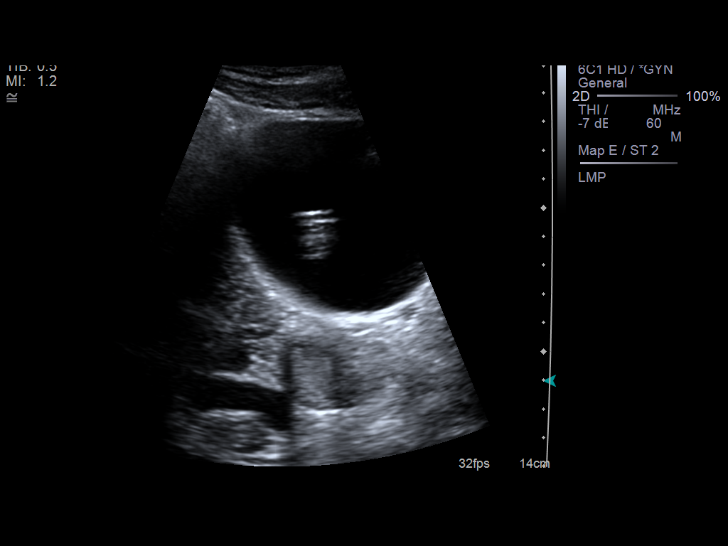
[im 24/45]
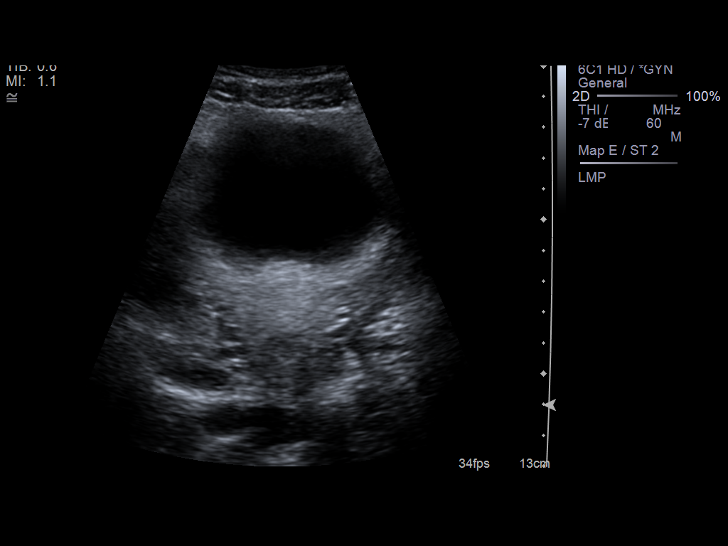
[im 28/45]
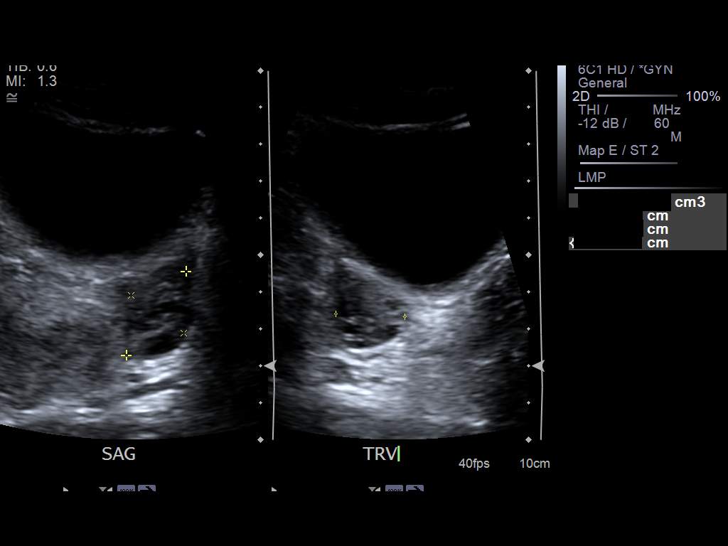
[im 30/45]
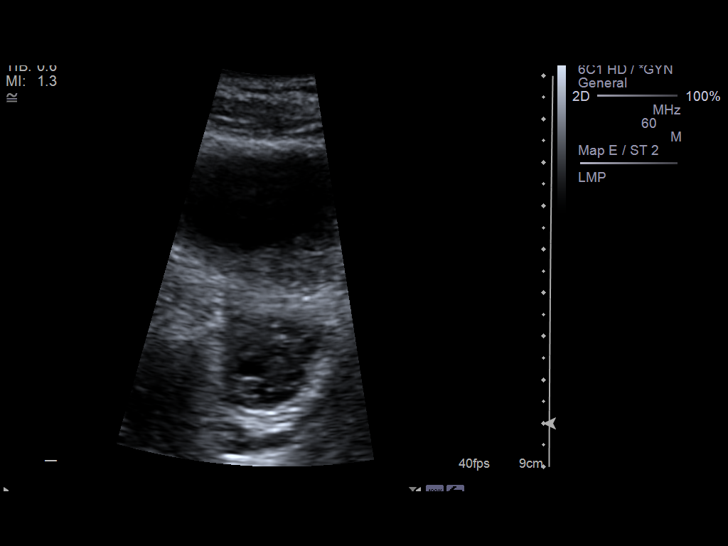
[im 34/45]
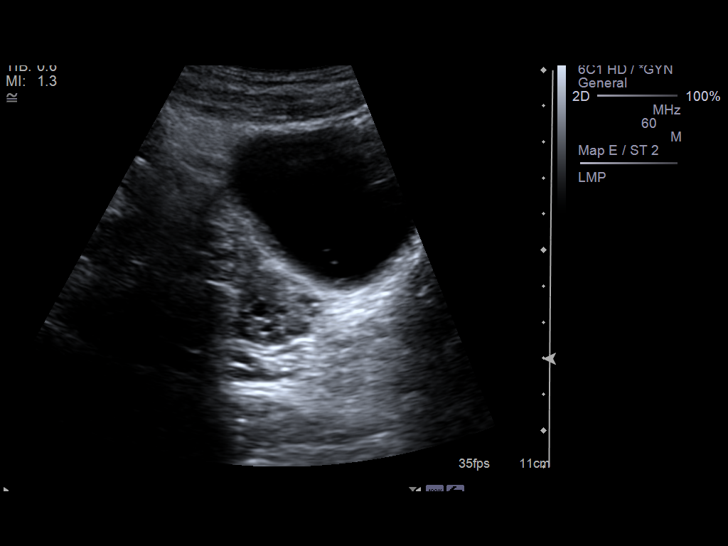
[im 37/45]
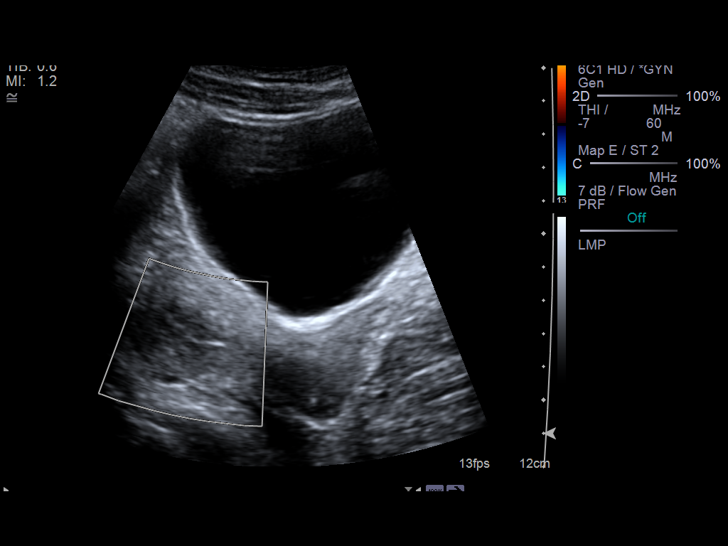
[im 41/45]
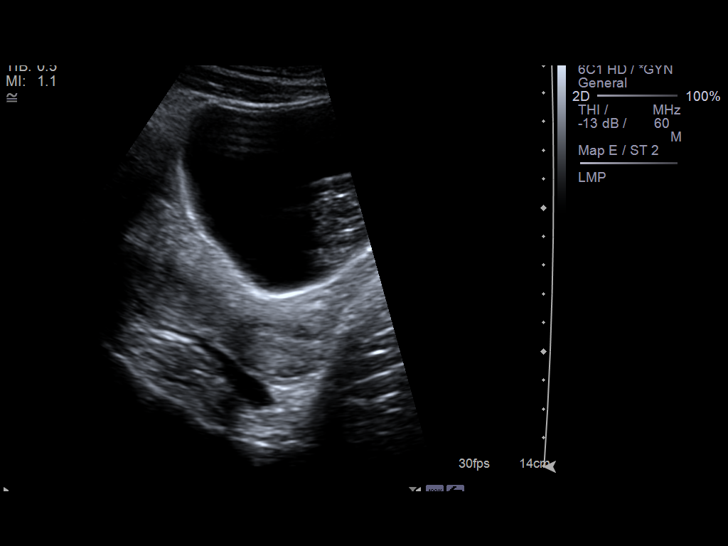
[im 45/45]
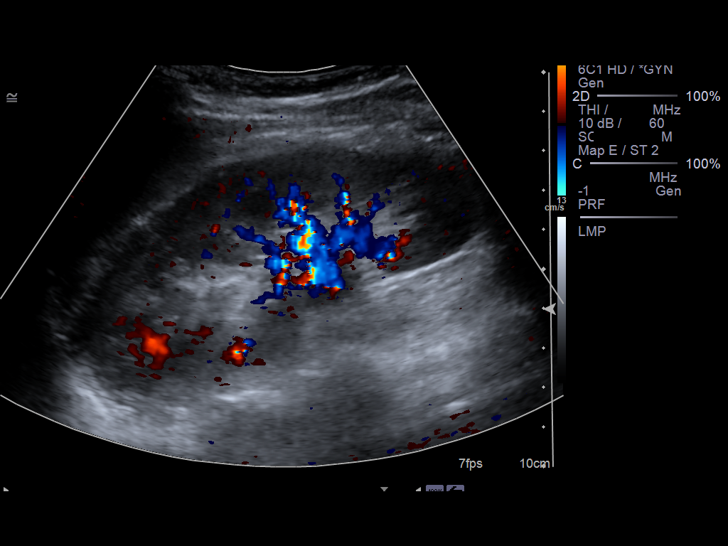

[14 of 25 positions shown; findings below may reference images not displayed]

FINDINGS: The size, shape, and echotexture of the uterus normal. Endometrial
thickness is normal at 2 mm. Ovaries are unremarkable with normal flow.
Small amount of free fluid in the cul-de-sac.
IMPRESSION: Nonspecific small nodule in the cul-de-sac. Exam otherwise
unremarkable.

## 2014-05-23 ENCOUNTER — Encounter: Payer: Managed Care, Other (non HMO) | Admitting: Women's Health

## 2014-05-26 ENCOUNTER — Telehealth: Payer: Self-pay | Admitting: *Deleted

## 2014-05-26 DIAGNOSIS — Z30011 Encounter for initial prescription of contraceptive pills: Secondary | ICD-10-CM

## 2014-05-26 MED ORDER — NORGESTIMATE-ETH ESTRADIOL 0.25-35 MG-MCG PO TABS: 1.0000 | ORAL_TABLET | Freq: Every day | ORAL | Status: DC

## 2014-05-26 NOTE — Telephone Encounter (Signed)
Pt annual was rescheduled due to to winter weather, now scheduled on 05/28/14. Pt is out of birth control pills, needs 1 pack sent to pharmacy. Rx sent.

## 2014-05-28 ENCOUNTER — Other Ambulatory Visit: Payer: Self-pay | Admitting: Women's Health

## 2014-05-28 ENCOUNTER — Encounter: Payer: Self-pay | Admitting: Women's Health

## 2014-05-28 ENCOUNTER — Ambulatory Visit (INDEPENDENT_AMBULATORY_CARE_PROVIDER_SITE_OTHER): Payer: Managed Care, Other (non HMO) | Admitting: Women's Health

## 2014-05-28 VITALS — BP 110/80 | Wt 132.0 lb

## 2014-05-28 DIAGNOSIS — B373 Candidiasis of vulva and vagina: Secondary | ICD-10-CM

## 2014-05-28 DIAGNOSIS — Z113 Encounter for screening for infections with a predominantly sexual mode of transmission: Secondary | ICD-10-CM

## 2014-05-28 DIAGNOSIS — B3731 Acute candidiasis of vulva and vagina: Secondary | ICD-10-CM

## 2014-05-28 DIAGNOSIS — Z30011 Encounter for initial prescription of contraceptive pills: Secondary | ICD-10-CM

## 2014-05-28 DIAGNOSIS — Z01419 Encounter for gynecological examination (general) (routine) without abnormal findings: Secondary | ICD-10-CM

## 2014-05-28 DIAGNOSIS — E038 Other specified hypothyroidism: Secondary | ICD-10-CM

## 2014-05-28 LAB — CBC WITH DIFFERENTIAL/PLATELET
BASOS PCT: 1 % (ref 0–1)
Basophils Absolute: 0 10*3/uL (ref 0.0–0.1)
EOS PCT: 1 % (ref 0–5)
Eosinophils Absolute: 0 10*3/uL (ref 0.0–0.7)
HCT: 38.9 % (ref 36.0–46.0)
Hemoglobin: 13 g/dL (ref 12.0–15.0)
LYMPHS ABS: 1.9 10*3/uL (ref 0.7–4.0)
LYMPHS PCT: 45 % (ref 12–46)
MCH: 30.4 pg (ref 26.0–34.0)
MCHC: 33.4 g/dL (ref 30.0–36.0)
MCV: 91.1 fL (ref 78.0–100.0)
MPV: 10 fL (ref 8.6–12.4)
Monocytes Absolute: 0.3 10*3/uL (ref 0.1–1.0)
Monocytes Relative: 8 % (ref 3–12)
NEUTROS ABS: 1.9 10*3/uL (ref 1.7–7.7)
NEUTROS PCT: 45 % (ref 43–77)
Platelets: 193 10*3/uL (ref 150–400)
RBC: 4.27 MIL/uL (ref 3.87–5.11)
RDW: 13.6 % (ref 11.5–15.5)
WBC: 4.3 10*3/uL (ref 4.0–10.5)

## 2014-05-28 LAB — WET PREP FOR TRICH, YEAST, CLUE
CLUE CELLS WET PREP: NONE SEEN
Trich, Wet Prep: NONE SEEN

## 2014-05-28 MED ORDER — LEVOTHYROXINE SODIUM 50 MCG PO TABS: 50.0000 ug | ORAL_TABLET | Freq: Every day | ORAL | Status: DC

## 2014-05-28 MED ORDER — NORGESTIMATE-ETH ESTRADIOL 0.25-35 MG-MCG PO TABS: 1.0000 | ORAL_TABLET | Freq: Every day | ORAL | Status: DC

## 2014-05-28 MED ORDER — FLUCONAZOLE 150 MG PO TABS: 150.0000 mg | ORAL_TABLET | Freq: Once | ORAL | Status: DC

## 2014-05-28 NOTE — Progress Notes (Signed)
Angela Robinson 04/01/1991 [id number]    History:    Presents for annual exam.  Regular monthly cycle on Ortho-Cyclen without complaint. Gardasil series completed. Hypothyroid. Bipolar managed by Dr. Tomasa Randunningham. Normal Pap history. Same partner but recent breakup.  Past medical history, past surgical history, family history and social history were all reviewed and documented in the EPIC chart. Working at [employer], taking classes part-time. Living at home.  ROS:  A ROS was performed and pertinent positives and negatives are included.  Exam:  Filed Vitals:   05/28/14 1432  BP: 110/80    General appearance:  Normal Thyroid:  Symmetrical, normal in size, without palpable masses or nodularity. Respiratory  Auscultation:  Clear without wheezing or rhonchi Cardiovascular  Auscultation:  Regular rate, without rubs, murmurs or gallops  Edema/varicosities:  Not grossly evident Abdominal  Soft,nontender, without masses, guarding or rebound.  Liver/spleen:  No organomegaly noted  Hernia:  None appreciated  Skin  Inspection:  Grossly normal   Breasts: Examined lying and sitting.     Right: Without masses, retractions, discharge or axillary adenopathy.     Left: Without masses, retractions, discharge or axillary adenopathy. Gentitourinary   Inguinal/mons:  Normal without inguinal adenopathy  External genitalia:  Erythematous  BUS/Urethra/Skene's glands:  Normal  Vagina:  Minimal erythema, wet prep positive for yeast  Cervix:  Normal  Uterus:   normal in size, shape and contour.  Midline and mobile  Adnexa/parametria:     Rt: Without masses or tenderness.   Lt: Without masses or tenderness.  Anus and perineum: Normal    Assessment/Plan:  24 y.o. SWF G0 for annual exam with complaint of mild vaginal itching   Yeast vaginitis STD screen  Bipolar-Dr. Tomasa Randunningham manages counseling and meds Hypothyroid on Synthroid  Plan: Ortho-Cyclen prescription, proper use given and reviewed  slight risk for blood clots and strokes. Condoms encouraged until permanent partner. Diflucan 150 by mouth times one dose with refill, yeast prevention discussed instructed to call if no relief of vaginal itching. Synthroid 50 g by mouth daily prescription, proper use given and reviewed. SBE's, regular exercise, calcium rich diet, MVI daily encouraged. CBC, TSH, UA, GC/Chlamydia, HIV, hep B, C, RPR. Pap normal 2013, new screening guidelines reviewed.   Harrington ChallengerYOUNG,Kimyatta Lecy J [facility] DivisionWHNP, 3:49 PM 05/28/2014

## 2014-05-28 NOTE — Patient Instructions (Signed)

## 2014-05-29 LAB — URINALYSIS W MICROSCOPIC + REFLEX CULTURE
BILIRUBIN URINE: NEGATIVE
Casts: NONE SEEN
Crystals: NONE SEEN
Glucose, UA: NEGATIVE mg/dL
Hgb urine dipstick: NEGATIVE
Leukocytes, UA: NEGATIVE
NITRITE: NEGATIVE
PH: 6.5 (ref 5.0–8.0)
Protein, ur: NEGATIVE mg/dL
Specific Gravity, Urine: 1.026 (ref 1.005–1.030)
Urobilinogen, UA: 0.2 mg/dL (ref 0.0–1.0)

## 2014-05-29 LAB — HEPATITIS B SURFACE ANTIGEN: Hepatitis B Surface Ag: NEGATIVE

## 2014-05-29 LAB — HIV ANTIBODY (ROUTINE TESTING W REFLEX): HIV 1&2 Ab, 4th Generation: NONREACTIVE

## 2014-05-29 LAB — TSH: TSH: 2.451 u[IU]/mL (ref 0.350–4.500)

## 2014-05-29 LAB — GC/CHLAMYDIA PROBE AMP
CT Probe RNA: NEGATIVE
GC Probe RNA: NEGATIVE

## 2014-05-29 LAB — HEPATITIS C ANTIBODY: HCV Ab: NEGATIVE

## 2014-05-29 LAB — RPR

## 2014-05-30 LAB — URINE CULTURE
COLONY COUNT: NO GROWTH
Organism ID, Bacteria: NO GROWTH

## 2014-06-06 ENCOUNTER — Other Ambulatory Visit: Payer: Self-pay

## 2014-06-06 DIAGNOSIS — Z30011 Encounter for initial prescription of contraceptive pills: Secondary | ICD-10-CM

## 2014-06-06 MED ORDER — NORGESTIMATE-ETH ESTRADIOL 0.25-35 MG-MCG PO TABS
1.0000 | ORAL_TABLET | Freq: Every day | ORAL | Status: DC
Start: 2014-06-06 — End: 2015-06-02

## 2015-03-02 ENCOUNTER — Encounter: Payer: Self-pay | Admitting: Women's Health

## 2015-03-05 ENCOUNTER — Telehealth: Payer: Self-pay

## 2015-03-05 NOTE — Telephone Encounter (Signed)
Please call her mother, her TSH is in the normal range. We will recheck a CBC to make sure WBC is not lower. Best if she takes a woman's One-A-Day daily.

## 2015-03-05 NOTE — Telephone Encounter (Signed)
Mom called. Angela Robinson had labwork done through psychiatrist this week and had a low WBC count (3.3 and normal 4-10.5) . Mother very concerned. Mother also mentioned that Angela Robinson has not taken her generic Synthroid Rx for months now.  She has appt on 03/20/15 with you  and wants to know if okay to wait until then or should she come in sooner.  (Copy of her labwork has been scanned in to her chart.)

## 2015-03-05 NOTE — Telephone Encounter (Signed)
Do not have DPR access to speak with Mom. Mom had just left a message and then asked me to call daughter. I called Morrie Sheldonshley and  Per DPR access I left a message on her cell phone and let her know what [place] recommended and okay to just keep appt on 03/20/15.

## 2015-03-11 ENCOUNTER — Ambulatory Visit: Payer: Managed Care, Other (non HMO) | Admitting: Women's Health

## 2015-03-20 ENCOUNTER — Encounter: Payer: Self-pay | Admitting: Women's Health

## 2015-03-20 ENCOUNTER — Ambulatory Visit (INDEPENDENT_AMBULATORY_CARE_PROVIDER_SITE_OTHER): Payer: Managed Care, Other (non HMO) | Admitting: Women's Health

## 2015-03-20 VITALS — BP 115/80 | Wt 127.0 lb

## 2015-03-20 DIAGNOSIS — D729 Disorder of white blood cells, unspecified: Secondary | ICD-10-CM

## 2015-03-20 LAB — CBC WITH DIFFERENTIAL/PLATELET
BASOS ABS: 0 10*3/uL (ref 0.0–0.1)
BASOS PCT: 0 % (ref 0–1)
EOS ABS: 0.1 10*3/uL (ref 0.0–0.7)
Eosinophils Relative: 1 % (ref 0–5)
HCT: 37 % (ref 36.0–46.0)
HEMOGLOBIN: 12.5 g/dL (ref 12.0–15.0)
Lymphocytes Relative: 44 % (ref 12–46)
Lymphs Abs: 2.3 10*3/uL (ref 0.7–4.0)
MCH: 30.9 pg (ref 26.0–34.0)
MCHC: 33.8 g/dL (ref 30.0–36.0)
MCV: 91.4 fL (ref 78.0–100.0)
MONOS PCT: 9 % (ref 3–12)
MPV: 9.8 fL (ref 8.6–12.4)
Monocytes Absolute: 0.5 10*3/uL (ref 0.1–1.0)
NEUTROS ABS: 2.4 10*3/uL (ref 1.7–7.7)
NEUTROS PCT: 46 % (ref 43–77)
PLATELETS: 197 10*3/uL (ref 150–400)
RBC: 4.05 MIL/uL (ref 3.87–5.11)
RDW: 12.9 % (ref 11.5–15.5)
WBC: 5.2 10*3/uL (ref 4.0–10.5)

## 2015-03-20 NOTE — Progress Notes (Signed)
Patient ID: Constance Holstershley M Schick, female   DOB: 05/09/1990, 24 y.o.   MRN: [id number] Presents with several questions concerning labs and fatigue. 2013, hypothyroid started Synthroid 50 g , reports not taking it for the past year, Labs  from 03/02/2015 - TSH 1.08, WBC 3.7729m CMP normal.TSH normal 05/2014 , WBC has consistently been at the low end of normal, run in the 4's for the past few years. Monthly cycle on Sprintec/same partner. Cycle in September lasted greater than one week first time, denies missed pills. October cycle 3 days. Denies spotting between cycles, vaginal discharge or urinary symptoms. Bipolar sees psychiatrist on a regular basis on numerous medicines doing ok with work and school.  Plan: Appears well.   Slightly low WBC, Normal TSH on no medication  Plan: Repeat CBC. Reviewed will continue to monitor TSH on an annual basis. Reviewed importance of regular rest, going to bed and awaking at similar hours. Encouraged to cut back work hours to help with school success.

## 2015-06-02 ENCOUNTER — Ambulatory Visit (INDEPENDENT_AMBULATORY_CARE_PROVIDER_SITE_OTHER): Payer: Managed Care, Other (non HMO) | Admitting: Women's Health

## 2015-06-02 ENCOUNTER — Other Ambulatory Visit (HOSPITAL_COMMUNITY)
Admission: RE | Admit: 2015-06-02 | Discharge: 2015-06-02 | Disposition: A | Discharge status: Home / Self Care | Payer: Managed Care, Other (non HMO) | Source: Ambulatory Visit | Attending: Gynecology | Admitting: Gynecology

## 2015-06-02 ENCOUNTER — Encounter: Payer: Self-pay | Admitting: Women's Health

## 2015-06-02 VITALS — BP 124/80 | Ht 64.0 in | Wt 127.0 lb

## 2015-06-02 DIAGNOSIS — Z30011 Encounter for initial prescription of contraceptive pills: Secondary | ICD-10-CM

## 2015-06-02 DIAGNOSIS — Z1329 Encounter for screening for other suspected endocrine disorder: Secondary | ICD-10-CM

## 2015-06-02 DIAGNOSIS — Z01419 Encounter for gynecological examination (general) (routine) without abnormal findings: Secondary | ICD-10-CM

## 2015-06-02 LAB — COMPREHENSIVE METABOLIC PANEL
ALT: 7 U/L (ref 6–29)
AST: 14 U/L (ref 10–30)
Albumin: 3.3 g/dL — ABNORMAL LOW (ref 3.6–5.1)
Alkaline Phosphatase: 34 U/L (ref 33–115)
BUN: 15 mg/dL (ref 7–25)
CO2: 25 mmol/L (ref 20–31)
Calcium: 9.1 mg/dL (ref 8.6–10.2)
Chloride: 104 mmol/L (ref 98–110)
Creat: 0.76 mg/dL (ref 0.50–1.10)
Glucose, Bld: 87 mg/dL (ref 65–99)
Potassium: 3.7 mmol/L (ref 3.5–5.3)
SODIUM: 136 mmol/L (ref 135–146)
TOTAL PROTEIN: 6.5 g/dL (ref 6.1–8.1)
Total Bilirubin: 0.5 mg/dL (ref 0.2–1.2)

## 2015-06-02 LAB — CBC WITH DIFFERENTIAL/PLATELET
BASOS ABS: 0 10*3/uL (ref 0.0–0.1)
Basophils Relative: 1 % (ref 0–1)
Eosinophils Absolute: 0.1 10*3/uL (ref 0.0–0.7)
Eosinophils Relative: 2 % (ref 0–5)
HEMATOCRIT: 39 % (ref 36.0–46.0)
HEMOGLOBIN: 13 g/dL (ref 12.0–15.0)
LYMPHS PCT: 46 % (ref 12–46)
Lymphs Abs: 1.8 10*3/uL (ref 0.7–4.0)
MCH: 31 pg (ref 26.0–34.0)
MCHC: 33.3 g/dL (ref 30.0–36.0)
MCV: 92.9 fL (ref 78.0–100.0)
MPV: 9.9 fL (ref 8.6–12.4)
Monocytes Absolute: 0.4 10*3/uL (ref 0.1–1.0)
Monocytes Relative: 10 % (ref 3–12)
NEUTROS ABS: 1.6 10*3/uL — AB (ref 1.7–7.7)
NEUTROS PCT: 41 % — AB (ref 43–77)
Platelets: 144 10*3/uL — ABNORMAL LOW (ref 150–400)
RBC: 4.2 MIL/uL (ref 3.87–5.11)
RDW: 13 % (ref 11.5–15.5)
WBC: 3.9 10*3/uL — AB (ref 4.0–10.5)

## 2015-06-02 LAB — TSH: TSH: 3.716 u[IU]/mL (ref 0.350–4.500)

## 2015-06-02 MED ORDER — NORGESTIMATE-ETH ESTRADIOL 0.25-35 MG-MCG PO TABS: 1.0000 | ORAL_TABLET | Freq: Every day | ORAL | Status: DC

## 2015-06-02 NOTE — Progress Notes (Signed)
Nene Aranas Schick Sep 04, 1990 [id number]    History:    Presents for annual exam.  Monthly cycle on Ortho-Cyclen. Same partner years. Bipolar disease psychiatrist managing. Normal Pap history, completed gardasil. Hypothyroid in the past, normal TSH currently on no medication.  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  ROS:  A ROS was performed and pertinent positives and negatives are included.  Exam:  Filed Vitals:   06/02/15 1221  BP: 124/80    General appearance:  Normal Thyroid:  Symmetrical, normal in size, without palpable masses or nodularity. Respiratory  Auscultation:  Clear without wheezing or rhonchi Cardiovascular  Auscultation:  Regular rate, without rubs, murmurs or gallops  Edema/varicosities:  Not grossly evident Abdominal  Soft,nontender, without masses, guarding or rebound.  Liver/spleen:  No organomegaly noted  Hernia:  None appreciated  Skin  Inspection:  Grossly normal   Breasts: Examined lying and sitting.     Right: Without masses, retractions, discharge or axillary adenopathy.     Left: Without masses, retractions, discharge or axillary adenopathy. Gentitourinary   Inguinal/mons:  Normal without inguinal adenopathy  External genitalia:  Normal  BUS/Urethra/Skene's glands:  Normal  Vagina:  Normal  Cervix:  Normal  Uterus:  normal in size, shape and contour.  Midline and mobile  Adnexa/parametria:     Rt: Without masses or tenderness.   Lt: Without masses or tenderness.  Anus and perineum: Normal    Assessment/Plan:  25 y.o. S WF G0 for annual exam with no complaints.  Monthly cycle on Ortho-Cyclen Bipolar psychiatrist manages meds  Plan: Ortho-Cyclen prescription, proper use, slight risk for blood clots and strokes reviewed. Condoms encouraged until permanent partner. SBE's, regular exercise, calcium rich diet, MVI  daily encouraged. CBC, CMP, TSH, Lamictal level per request, UA, Pap, Pap normal 2013, new screening guidelines reviewed.  YOUNG,NANCY J WHNP, 1:32 PM 06/02/2015

## 2015-06-02 NOTE — Patient Instructions (Signed)
Health Maintenance, Female Adopting a healthy lifestyle and getting preventive care can go a long way to promote health and wellness. Talk with your health care provider about what schedule of regular examinations is right for you. This is a good chance for you to check in with your provider about disease prevention and staying healthy. In between checkups, there are plenty of things you can do on your own. Experts have done a lot of research about which lifestyle changes and preventive measures are most likely to keep you healthy. Ask your health care provider for more information. WEIGHT AND DIET  Eat a healthy diet  Be sure to include plenty of vegetables, fruits, low-fat dairy products, and lean protein.  Do not eat a lot of foods high in solid fats, added sugars, or salt.  Get regular exercise. This is one of the most important things you can do for your health.  Most adults should exercise for at least 150 minutes each week. The exercise should increase your heart rate and make you sweat (moderate-intensity exercise).  Most adults should also do strengthening exercises at least twice a week. This is in addition to the moderate-intensity exercise.  Maintain a healthy weight  Body mass index (BMI) is a measurement that can be used to identify possible weight problems. It estimates body fat based on height and weight. Your health care provider can help determine your BMI and help you achieve or maintain a healthy weight.  For females 20 years of age and older:   A BMI below 18.5 is considered underweight.  A BMI of 18.5 to 24.9 is normal.  A BMI of 25 to 29.9 is considered overweight.  A BMI of 30 and above is considered obese.  Watch levels of cholesterol and blood lipids  You should start having your blood tested for lipids and cholesterol at 26 years of age, then have this test every 5 years.  You may need to have your cholesterol levels checked more often if:  Your lipid  or cholesterol levels are high.  You are older than 26 years of age.  You are at high risk for heart disease.  CANCER SCREENING   Lung Cancer  Lung cancer screening is recommended for adults 55-80 years old who are at high risk for lung cancer because of a history of smoking.  A yearly low-dose CT scan of the lungs is recommended for people who:  Currently smoke.  Have quit within the past 15 years.  Have at least a 30-pack-year history of smoking. A pack year is smoking an average of one pack of cigarettes a day for 1 year.  Yearly screening should continue until it has been 15 years since you quit.  Yearly screening should stop if you develop a health problem that would prevent you from having lung cancer treatment.  Breast Cancer  Practice breast self-awareness. This means understanding how your breasts normally appear and feel.  It also means doing regular breast self-exams. Let your health care provider know about any changes, no matter how small.  If you are in your 20s or 30s, you should have a clinical breast exam (CBE) by a health care provider every 1-3 years as part of a regular health exam.  If you are 40 or older, have a CBE every year. Also consider having a breast X-ray (mammogram) every year.  If you have a family history of breast cancer, talk to your health care provider about genetic screening.  If you   are at high risk for breast cancer, talk to your health care provider about having an MRI and a mammogram every year.  Breast cancer gene (BRCA) assessment is recommended for women who have family members with BRCA-related cancers. BRCA-related cancers include:  Breast.  Ovarian.  Tubal.  Peritoneal cancers.  Results of the assessment will determine the need for genetic counseling and BRCA1 and BRCA2 testing. Cervical Cancer Your health care provider may recommend that you be screened regularly for cancer of the pelvic organs (ovaries, uterus, and  vagina). This screening involves a pelvic examination, including checking for microscopic changes to the surface of your cervix (Pap test). You may be encouraged to have this screening done every 3 years, beginning at age 21.  For women ages 30-65, health care providers may recommend pelvic exams and Pap testing every 3 years, or they may recommend the Pap and pelvic exam, combined with testing for human papilloma virus (HPV), every 5 years. Some types of HPV increase your risk of cervical cancer. Testing for HPV may also be done on women of any age with unclear Pap test results.  Other health care providers may not recommend any screening for nonpregnant women who are considered low risk for pelvic cancer and who do not have symptoms. Ask your health care provider if a screening pelvic exam is right for you.  If you have had past treatment for cervical cancer or a condition that could lead to cancer, you need Pap tests and screening for cancer for at least 20 years after your treatment. If Pap tests have been discontinued, your risk factors (such as having a new sexual partner) need to be reassessed to determine if screening should resume. Some women have medical problems that increase the chance of getting cervical cancer. In these cases, your health care provider may recommend more frequent screening and Pap tests. Colorectal Cancer  This type of cancer can be detected and often prevented.  Routine colorectal cancer screening usually begins at 26 years of age and continues through 26 years of age.  Your health care provider may recommend screening at an earlier age if you have risk factors for colon cancer.  Your health care provider may also recommend using home test kits to check for hidden blood in the stool.  A small camera at the end of a tube can be used to examine your colon directly (sigmoidoscopy or colonoscopy). This is done to check for the earliest forms of colorectal  cancer.  Routine screening usually begins at age 50.  Direct examination of the colon should be repeated every 5-10 years through 26 years of age. However, you may need to be screened more often if early forms of precancerous polyps or small growths are found. Skin Cancer  Check your skin from head to toe regularly.  Tell your health care provider about any new moles or changes in moles, especially if there is a change in a mole's shape or color.  Also tell your health care provider if you have a mole that is larger than the size of a pencil eraser.  Always use sunscreen. Apply sunscreen liberally and repeatedly throughout the day.  Protect yourself by wearing long sleeves, pants, a wide-brimmed hat, and sunglasses whenever you are outside. HEART DISEASE, DIABETES, AND HIGH BLOOD PRESSURE   High blood pressure causes heart disease and increases the risk of stroke. High blood pressure is more likely to develop in:  People who have blood pressure in the high end   of the normal range (130-139/85-89 mm Hg).  People who are overweight or obese.  People who are African American.  If you are 38-23 years of age, have your blood pressure checked every 3-5 years. If you are 61 years of age or older, have your blood pressure checked every year. You should have your blood pressure measured twice--once when you are at a hospital or clinic, and once when you are not at a hospital or clinic. Record the average of the two measurements. To check your blood pressure when you are not at a hospital or clinic, you can use:  An automated blood pressure machine at a pharmacy.  A home blood pressure monitor.  If you are between 45 years and 39 years old, ask your health care provider if you should take aspirin to prevent strokes.  Have regular diabetes screenings. This involves taking a blood sample to check your fasting blood sugar level.  If you are at a normal weight and have a low risk for diabetes,  have this test once every three years after 26 years of age.  If you are overweight and have a high risk for diabetes, consider being tested at a younger age or more often. PREVENTING INFECTION  Hepatitis B  If you have a higher risk for hepatitis B, you should be screened for this virus. You are considered at high risk for hepatitis B if:  You were born in a country where hepatitis B is common. Ask your health care provider which countries are considered high risk.  Your parents were born in a high-risk country, and you have not been immunized against hepatitis B (hepatitis B vaccine).  You have HIV or AIDS.  You use needles to inject street drugs.  You live with someone who has hepatitis B.  You have had sex with someone who has hepatitis B.  You get hemodialysis treatment.  You take certain medicines for conditions, including cancer, organ transplantation, and autoimmune conditions. Hepatitis C  Blood testing is recommended for:  Everyone born from [birth year] through 1966.  Anyone with known risk factors for hepatitis C. Sexually transmitted infections (STIs)  You should be screened for sexually transmitted infections (STIs) including gonorrhea and chlamydia if:  You are sexually active and are younger than 26 years of age.  You are older than 26 years of age and your health care provider tells you that you are at risk for this type of infection.  Your sexual activity has changed since you were last screened and you are at an increased risk for chlamydia or gonorrhea. Ask your health care provider if you are at risk.  If you do not have HIV, but are at risk, it may be recommended that you take a prescription medicine daily to prevent HIV infection. This is called pre-exposure prophylaxis (PrEP). You are considered at risk if:  You are sexually active and do not regularly use condoms or know the HIV status of your partner(s).  You take drugs by injection.  You are sexually  active with a partner who has HIV. Talk with your health care provider about whether you are at high risk of being infected with HIV. If you choose to begin PrEP, you should first be tested for HIV. You should then be tested every 3 months for as long as you are taking PrEP.  PREGNANCY   If you are premenopausal and you may become pregnant, ask your health care provider about preconception counseling.  If you may  become pregnant, take 400 to 800 micrograms (mcg) of folic acid every day.  If you want to prevent pregnancy, talk to your health care provider about birth control (contraception). OSTEOPOROSIS AND MENOPAUSE   Osteoporosis is a disease in which the bones lose minerals and strength with aging. This can result in serious bone fractures. Your risk for osteoporosis can be identified using a bone density scan.  If you are 61 years of age or older, or if you are at risk for osteoporosis and fractures, ask your health care provider if you should be screened.  Ask your health care provider whether you should take a calcium or vitamin D supplement to lower your risk for osteoporosis.  Menopause may have certain physical symptoms and risks.  Hormone replacement therapy may reduce some of these symptoms and risks. Talk to your health care provider about whether hormone replacement therapy is right for you.  HOME CARE INSTRUCTIONS   Schedule regular health, dental, and eye exams.  Stay current with your immunizations.   Do not use any tobacco products including cigarettes, chewing tobacco, or electronic cigarettes.  If you are pregnant, do not drink alcohol.  If you are breastfeeding, limit how much and how often you drink alcohol.  Limit alcohol intake to no more than 1 drink per day for nonpregnant women. One drink equals 12 ounces of beer, 5 ounces of wine, or 1 ounces of hard liquor.  Do not use street drugs.  Do not share needles.  Ask your health care provider for help if  you need support or information about quitting drugs.  Tell your health care provider if you often feel depressed.  Tell your health care provider if you have ever been abused or do not feel safe at home.   This information is not intended to replace advice given to you by your health care provider. Make sure you discuss any questions you have with your health care provider.   Document Released: 11/01/2010 Document Revised: 05/09/2014 Document Reviewed: 03/20/2013 Elsevier Interactive Patient Education [employer].

## 2015-06-04 ENCOUNTER — Telehealth: Payer: Self-pay

## 2015-06-04 LAB — LAMOTRIGINE LEVEL: Lamotrigine Lvl: 7.2 ug/mL (ref 4.0–18.0)

## 2015-06-04 LAB — CYTOLOGY - PAP

## 2015-06-04 NOTE — Telephone Encounter (Signed)
Patient's mother called in voice mail wanting to know about Angela Robinson Depakote level and if the results had been sent to [facility]. In patients FYI notes it indicates "Patient denies DPR access. Deal only with patient".  I did not call Mom back.

## 2015-08-06 ENCOUNTER — Telehealth: Payer: Self-pay | Admitting: *Deleted

## 2015-08-06 ENCOUNTER — Other Ambulatory Visit: Payer: Self-pay

## 2015-08-06 NOTE — Telephone Encounter (Signed)
Yes her TSH was normal and she had been off the Synthroid. I just left a message on her voicemail that we did not call in Synthroid since her TSH was normal.

## 2015-08-06 NOTE — Telephone Encounter (Signed)
I received refill request from [facility] for levothyroxine 50 mcg. In [place] when [place] saw [place] note said she was on NO medication for her thyroid. Her TSH was normal. Refill denied.

## 2015-08-06 NOTE — Telephone Encounter (Signed)
Pt mother Malachi Bonds(Gloria) called regarding pt vomiting on last Friday and starting a early cycle. I called pt to speak with her as we do not have DPR access to speak with mother. Pt said she is fine and her mother is just a worried. Pt said she is not bleeding heavy, takes pills daily on time, thinks the vomiting was due to stomach bug at the restaurant. Pt said she will call if she has any further questions.

## 2016-04-12 DIAGNOSIS — B9689 Other specified bacterial agents as the cause of diseases classified elsewhere: Secondary | ICD-10-CM | POA: Diagnosis not present

## 2016-04-12 DIAGNOSIS — R05 Cough: Secondary | ICD-10-CM | POA: Diagnosis not present

## 2016-04-12 DIAGNOSIS — J019 Acute sinusitis, unspecified: Secondary | ICD-10-CM | POA: Diagnosis not present

## 2016-04-15 DIAGNOSIS — F3175 Bipolar disorder, in partial remission, most recent episode depressed: Secondary | ICD-10-CM | POA: Diagnosis not present

## 2016-06-02 ENCOUNTER — Encounter: Payer: Self-pay | Admitting: Women's Health

## 2016-06-02 ENCOUNTER — Ambulatory Visit (INDEPENDENT_AMBULATORY_CARE_PROVIDER_SITE_OTHER): Payer: BLUE CROSS/BLUE SHIELD | Admitting: Women's Health

## 2016-06-02 VITALS — BP 120/76 | Ht 63.5 in | Wt 125.0 lb

## 2016-06-02 DIAGNOSIS — E038 Other specified hypothyroidism: Secondary | ICD-10-CM | POA: Diagnosis not present

## 2016-06-02 DIAGNOSIS — Z01419 Encounter for gynecological examination (general) (routine) without abnormal findings: Secondary | ICD-10-CM | POA: Diagnosis not present

## 2016-06-02 DIAGNOSIS — Z30011 Encounter for initial prescription of contraceptive pills: Secondary | ICD-10-CM | POA: Diagnosis not present

## 2016-06-02 LAB — CBC WITH DIFFERENTIAL/PLATELET
Basophils Absolute: 0 cells/uL (ref 0–200)
Basophils Relative: 0 %
EOS PCT: 0 %
Eosinophils Absolute: 0 cells/uL — ABNORMAL LOW (ref 15–500)
HCT: 37.8 % (ref 35.0–45.0)
HEMOGLOBIN: 12.5 g/dL (ref 11.7–15.5)
LYMPHS ABS: 1702 {cells}/uL (ref 850–3900)
Lymphocytes Relative: 37 %
MCH: 30.6 pg (ref 27.0–33.0)
MCHC: 33.1 g/dL (ref 32.0–36.0)
MCV: 92.4 fL (ref 80.0–100.0)
MPV: 9.8 fL (ref 7.5–12.5)
Monocytes Absolute: 414 cells/uL (ref 200–950)
Monocytes Relative: 9 %
Neutro Abs: 2484 cells/uL (ref 1500–7800)
Neutrophils Relative %: 54 %
Platelets: 158 10*3/uL (ref 140–400)
RBC: 4.09 MIL/uL (ref 3.80–5.10)
RDW: 12.7 % (ref 11.0–15.0)
WBC: 4.6 10*3/uL (ref 3.8–10.8)

## 2016-06-02 LAB — TSH: TSH: 3.17 m[IU]/L

## 2016-06-02 LAB — GLUCOSE, RANDOM: GLUCOSE: 76 mg/dL (ref 65–99)

## 2016-06-02 MED ORDER — NORGESTIMATE-ETH ESTRADIOL 0.25-35 MG-MCG PO TABS: 1.0000 | ORAL_TABLET | Freq: Every day | ORAL | 4 refills | Status: DC

## 2016-06-02 NOTE — Patient Instructions (Signed)

## 2016-06-02 NOTE — Progress Notes (Signed)
Angela Robinson 08/27/1990 [id number]    History:    Presents for annual exam.  Monthly cycle on Sprintec, same partner with negative STD screen.  Normal Pap history. Gardasil series completed. History of hypothyroidism, currently on no supplement. Anxiety/depression and bipolar disease managed by psychiatrist. States feeling shaky and dizzy if she does not eat on a regular basis. Also states has been more cold lately.  Past medical history, past surgical history, family history and social history were all reviewed and documented in the EPIC chart. 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 Mother mental health issues, father healthy.  ROS:  A ROS was performed and pertinent positives and negatives are included.  Exam:  Vitals:   06/02/16 1145  BP: 120/76  Weight: 125 lb (56.7 kg)  Height: 5' 3.5" (1.613 m)   Body mass index is 21.8 kg/m.   General appearance:  Normal Thyroid:  Symmetrical, normal in size, without palpable masses or nodularity. Respiratory  Auscultation:  Clear without wheezing or rhonchi Cardiovascular  Auscultation:  Regular rate, without rubs, murmurs or gallops  Edema/varicosities:  Not grossly evident Abdominal  Soft,nontender, without masses, guarding or rebound.  Liver/spleen:  No organomegaly noted  Hernia:  None appreciated  Skin  Inspection:  Grossly normal   Breasts: Examined lying and sitting.     Right: Without masses, retractions, discharge or axillary adenopathy.     Left: Without masses, retractions, discharge or axillary adenopathy. Gentitourinary   Inguinal/mons:  Normal without inguinal adenopathy  External genitalia:  Normal  BUS/Urethra/Skene's glands:  Normal  Vagina:  Normal  Cervix:  Normal  Uterus:   normal in size, shape and contour.  Midline and mobile  Adnexa/parametria:     Rt: Without masses or tenderness.   Lt: Without masses or tenderness.  Anus and perineum: Normal  Digital rectal exam: Normal sphincter tone  without palpated masses or tenderness  Assessment/Plan:  26 y.o. engaged WF G0  for annual exam complaint of feeling cold all the time and lightheaded especially if missed meals.  Monthly cycle on Sprintec Anxiety/depression, bipolar-psychiatrist manages  Plan: Sprintec prescription, proper use given and reviewed slight risk for blood clots and strokes. Not planning on children for another year. SBE's, exercise, calcium rich diet, MVI daily encouraged. Aware to coordinate decreasing medications with psychiatrist prior to pregnancy, aware of potential  dangers of Depakote with pregnancy.. CBC, glucose, rubella titer, TSH, Pap normal 2017, new screening guidelines reviewed.    Harrington ChallengerYOUNG,Trig Mcbryar J [facility] BentonvilleWHNP, 12:18 PM 06/02/2016

## 2016-06-03 LAB — RUBELLA SCREEN: Rubella: 1.28 Index — ABNORMAL HIGH (ref <0.90)

## 2016-09-14 ENCOUNTER — Encounter: Payer: Self-pay | Admitting: Gynecology

## 2016-09-30 DIAGNOSIS — F3175 Bipolar disorder, in partial remission, most recent episode depressed: Secondary | ICD-10-CM | POA: Diagnosis not present

## 2016-11-17 DIAGNOSIS — Z79899 Other long term (current) drug therapy: Secondary | ICD-10-CM | POA: Diagnosis not present

## 2016-11-22 DIAGNOSIS — H6983 Other specified disorders of Eustachian tube, bilateral: Secondary | ICD-10-CM | POA: Diagnosis not present

## 2016-11-22 DIAGNOSIS — H9203 Otalgia, bilateral: Secondary | ICD-10-CM | POA: Diagnosis not present

## 2017-04-27 DIAGNOSIS — F3175 Bipolar disorder, in partial remission, most recent episode depressed: Secondary | ICD-10-CM | POA: Diagnosis not present

## 2017-06-09 DIAGNOSIS — R6889 Other general symptoms and signs: Secondary | ICD-10-CM | POA: Diagnosis not present

## 2017-06-09 DIAGNOSIS — J029 Acute pharyngitis, unspecified: Secondary | ICD-10-CM | POA: Diagnosis not present

## 2017-06-09 DIAGNOSIS — B9689 Other specified bacterial agents as the cause of diseases classified elsewhere: Secondary | ICD-10-CM | POA: Diagnosis not present

## 2017-06-09 DIAGNOSIS — J019 Acute sinusitis, unspecified: Secondary | ICD-10-CM | POA: Diagnosis not present

## 2017-07-25 ENCOUNTER — Other Ambulatory Visit: Payer: Self-pay | Admitting: Women's Health

## 2017-07-25 ENCOUNTER — Ambulatory Visit: Payer: BLUE CROSS/BLUE SHIELD | Admitting: Women's Health

## 2017-07-25 ENCOUNTER — Encounter: Payer: Self-pay | Admitting: Women's Health

## 2017-07-25 VITALS — BP 122/80 | Ht 63.0 in | Wt 130.0 lb

## 2017-07-25 DIAGNOSIS — R61 Generalized hyperhidrosis: Secondary | ICD-10-CM

## 2017-07-25 DIAGNOSIS — Z01419 Encounter for gynecological examination (general) (routine) without abnormal findings: Secondary | ICD-10-CM

## 2017-07-25 DIAGNOSIS — N760 Acute vaginitis: Secondary | ICD-10-CM | POA: Diagnosis not present

## 2017-07-25 DIAGNOSIS — N898 Other specified noninflammatory disorders of vagina: Secondary | ICD-10-CM | POA: Diagnosis not present

## 2017-07-25 DIAGNOSIS — Z30011 Encounter for initial prescription of contraceptive pills: Secondary | ICD-10-CM

## 2017-07-25 DIAGNOSIS — B9689 Other specified bacterial agents as the cause of diseases classified elsewhere: Secondary | ICD-10-CM | POA: Diagnosis not present

## 2017-07-25 LAB — TSH: TSH: 3.02 mIU/L

## 2017-07-25 LAB — WET PREP FOR TRICH, YEAST, CLUE

## 2017-07-25 LAB — CBC WITH DIFFERENTIAL/PLATELET
BASOS ABS: 29 {cells}/uL (ref 0–200)
Basophils Relative: 0.7 %
EOS PCT: 1.5 %
Eosinophils Absolute: 62 cells/uL (ref 15–500)
HCT: 38.9 % (ref 35.0–45.0)
Hemoglobin: 13.2 g/dL (ref 11.7–15.5)
Lymphs Abs: 1866 cells/uL (ref 850–3900)
MCH: 30.8 pg (ref 27.0–33.0)
MCHC: 33.9 g/dL (ref 32.0–36.0)
MCV: 90.9 fL (ref 80.0–100.0)
MONOS PCT: 12.1 %
MPV: 10.4 fL (ref 7.5–12.5)
NEUTROS PCT: 40.2 %
Neutro Abs: 1648 cells/uL (ref 1500–7800)
PLATELETS: 142 10*3/uL (ref 140–400)
RBC: 4.28 10*6/uL (ref 3.80–5.10)
RDW: 11.6 % (ref 11.0–15.0)
TOTAL LYMPHOCYTE: 45.5 %
WBC mixed population: 496 cells/uL (ref 200–950)
WBC: 4.1 10*3/uL (ref 3.8–10.8)

## 2017-07-25 LAB — T4: T4 TOTAL: 7.2 ug/dL (ref 5.1–11.9)

## 2017-07-25 LAB — GLUCOSE, RANDOM: GLUCOSE: 85 mg/dL (ref 65–99)

## 2017-07-25 MED ORDER — METRONIDAZOLE 500 MG PO TABS: 500.0000 mg | ORAL_TABLET | Freq: Two times a day (BID) | ORAL | 0 refills | Status: DC

## 2017-07-25 MED ORDER — NORGESTIMATE-ETH ESTRADIOL 0.25-35 MG-MCG PO TABS: 1.0000 | ORAL_TABLET | Freq: Every day | ORAL | 4 refills | Status: DC

## 2017-07-25 NOTE — Progress Notes (Signed)
Angela Robinson 05/27/1990 [id number]    History:    Presents for annual exam.  Monthly cycle on Sprintec, ran out of prescription did not start this month not planning pregnancy at this time.  Gardasil series completed.  Normal Pap history.  Rubella positive.  Bipolar - psychiatrist  manages  medications and counseling.  Past medical history, past surgical history, family history and social history were all reviewed and documented in the EPIC chart.  Works at [employer].  Got married October 2018.  History of hypothyroidism in the past on no medication.  Mother mental health issues with severe anxiety.  ROS:  A ROS was performed and pertinent positives and negatives are included.  Exam:  Vitals:   07/25/17 1041  BP: 122/80  Weight: 130 lb (59 kg)  Height: 5\' 3"  (1.6 m)   Body mass index is 23.03 kg/m.   General appearance:  Normal Thyroid:  Symmetrical, normal in size, without palpable masses or nodularity. Respiratory  Auscultation:  Clear without wheezing or rhonchi Cardiovascular  Auscultation:  Regular rate, without rubs, murmurs or gallops  Edema/varicosities:  Not grossly evident Abdominal  Soft,nontender, without masses, guarding or rebound.  Liver/spleen:  No organomegaly noted  Hernia:  None appreciated  Skin  Inspection:  Grossly normal   Breasts: Examined lying and sitting.     Right: Without masses, retractions, discharge or axillary adenopathy.     Left: Without masses, retractions, discharge or axillary adenopathy. Gentitourinary   Inguinal/mons:  Normal without inguinal adenopathy  External genitalia:  Normal  BUS/Urethra/Skene's glands:  Normal  Vagina:  Normal  Cervix:  Normal  Uterus:  normal in size, shape and contour.  Midline and mobile  Adnexa/parametria:     Rt: Without masses or tenderness.   Lt: Without masses or tenderness.  Anus and perineum: Normal    Assessment/Plan:  27 y.o. MWF G0 for annual exam with complaint of increased  vaginal discharge.  Monthly cycle on Sprintec until March 2019 Bacterial vaginosis Bipolar-psychiatrist manages meds  Plan: Sprintec prescription, proper use, slight risk for blood clots and strokes reviewed.  Start up instructions reviewed encourage condoms until cycle starts and first month back on.  Flagyl 500 twice daily for 7 days, alcohol precautions reviewed.  SBE's, exercise, calcium rich foods, MVI daily encouraged.  Reviewed Depakote not safe with pregnancy and may need to decrease lamictal with pregnancy.  CBC, glucose, TSH , T4, Pap normal 2017, new screening guidelines reviewed.    Harrington Challengerancy J Brandin Dilday [facility] SurgeryWHNP, 11:20 AM 07/25/2017

## 2017-07-25 NOTE — Patient Instructions (Signed)

## 2017-07-25 NOTE — Progress Notes (Signed)
Resent

## 2017-07-28 ENCOUNTER — Other Ambulatory Visit: Payer: Self-pay

## 2017-07-28 ENCOUNTER — Telehealth: Payer: Self-pay | Admitting: *Deleted

## 2017-07-28 DIAGNOSIS — Z30011 Encounter for initial prescription of contraceptive pills: Secondary | ICD-10-CM

## 2017-07-28 MED ORDER — NORGESTIMATE-ETH ESTRADIOL 0.25-35 MG-MCG PO TABS: 1.0000 | ORAL_TABLET | Freq: Every day | ORAL | 4 refills | Status: DC

## 2017-07-28 NOTE — Telephone Encounter (Signed)
Pt needs birth control pills sent [facility] in [place].

## 2017-11-08 DIAGNOSIS — F3175 Bipolar disorder, in partial remission, most recent episode depressed: Secondary | ICD-10-CM | POA: Diagnosis not present

## 2017-11-22 DIAGNOSIS — S50361A Insect bite (nonvenomous) of right elbow, initial encounter: Secondary | ICD-10-CM | POA: Diagnosis not present

## 2017-11-22 DIAGNOSIS — W57XXXA Bitten or stung by nonvenomous insect and other nonvenomous arthropods, initial encounter: Secondary | ICD-10-CM | POA: Diagnosis not present

## 2017-11-22 DIAGNOSIS — L03113 Cellulitis of right upper limb: Secondary | ICD-10-CM | POA: Diagnosis not present

## 2018-03-27 ENCOUNTER — Other Ambulatory Visit: Payer: Self-pay

## 2018-03-27 MED ORDER — LAMOTRIGINE 100 MG PO TABS: 200.0000 mg | ORAL_TABLET | Freq: Every day | ORAL | 1 refills | Status: DC

## 2018-04-02 ENCOUNTER — Telehealth: Payer: Self-pay | Admitting: Physician Assistant

## 2018-04-02 NOTE — Telephone Encounter (Signed)
Need to discuss with provider and call back pt

## 2018-04-02 NOTE — Telephone Encounter (Signed)
Need a refill on Lamotrigine. [facility]. Please call mom to verify if need appointment .

## 2018-04-02 NOTE — Telephone Encounter (Signed)
Spoke with pt and she does not need a refill of lamictal, states her mom is just thinking ahead for next month. She will schedule her office visit to follow up

## 2018-04-16 DIAGNOSIS — O219 Vomiting of pregnancy, unspecified: Secondary | ICD-10-CM | POA: Diagnosis not present

## 2018-04-16 DIAGNOSIS — Z124 Encounter for screening for malignant neoplasm of cervix: Secondary | ICD-10-CM | POA: Diagnosis not present

## 2018-04-16 DIAGNOSIS — Z113 Encounter for screening for infections with a predominantly sexual mode of transmission: Secondary | ICD-10-CM | POA: Diagnosis not present

## 2018-04-16 DIAGNOSIS — Z8639 Personal history of other endocrine, nutritional and metabolic disease: Secondary | ICD-10-CM | POA: Diagnosis not present

## 2018-04-16 DIAGNOSIS — Z3A01 Less than 8 weeks gestation of pregnancy: Secondary | ICD-10-CM | POA: Diagnosis not present

## 2018-04-16 DIAGNOSIS — Z118 Encounter for screening for other infectious and parasitic diseases: Secondary | ICD-10-CM | POA: Diagnosis not present

## 2018-04-16 DIAGNOSIS — Z3401 Encounter for supervision of normal first pregnancy, first trimester: Secondary | ICD-10-CM | POA: Diagnosis not present

## 2018-04-22 ENCOUNTER — Encounter: Payer: Self-pay | Admitting: Emergency Medicine

## 2018-04-22 DIAGNOSIS — F3175 Bipolar disorder, in partial remission, most recent episode depressed: Secondary | ICD-10-CM | POA: Insufficient documentation

## 2018-04-27 ENCOUNTER — Ambulatory Visit: Payer: Self-pay | Admitting: Physician Assistant

## 2018-05-03 ENCOUNTER — Telehealth: Payer: Self-pay | Admitting: Physician Assistant

## 2018-05-03 NOTE — Telephone Encounter (Signed)
Angela Robinson wants to know if its ok for her daughter to continue Lamictal now that she is [redacted] weeks pregnant.

## 2018-05-03 NOTE — Telephone Encounter (Signed)
Left voicemail on pt's cell with message. Instructed to call back with any questions.

## 2018-05-03 NOTE — Telephone Encounter (Signed)
Yes.  Congrats!

## 2018-05-11 ENCOUNTER — Ambulatory Visit: Payer: Self-pay | Admitting: Physician Assistant

## 2018-05-17 ENCOUNTER — Ambulatory Visit: Payer: Self-pay | Admitting: Physician Assistant

## 2018-06-04 ENCOUNTER — Other Ambulatory Visit: Payer: Self-pay | Admitting: Physician Assistant

## 2018-06-04 MED ORDER — LAMOTRIGINE 100 MG PO TABS: 200.0000 mg | ORAL_TABLET | Freq: Every day | ORAL | 1 refills | Status: DC

## 2019-12-12 ENCOUNTER — Telehealth: Payer: Self-pay | Admitting: Physician Assistant

## 2019-12-12 NOTE — Telephone Encounter (Signed)
Patient called and said that she is not doing well . She said that she can't sleep, feels paranoid. Hormones are all over the place. She is trying to ween her son at the same time. She has an appt on Tuesday that she doesn't remember making. Please call her at [phone]

## 2019-12-13 NOTE — Telephone Encounter (Signed)
9:24 am, left msg on VM that I had called and will try again later.

## 2019-12-16 NOTE — Telephone Encounter (Signed)
Patient has apt tomorrow 08/17

## 2019-12-17 ENCOUNTER — Encounter: Payer: Self-pay | Admitting: Physician Assistant

## 2019-12-17 ENCOUNTER — Telehealth (INDEPENDENT_AMBULATORY_CARE_PROVIDER_SITE_OTHER): Payer: BC Managed Care – PPO | Admitting: Physician Assistant

## 2019-12-17 DIAGNOSIS — F319 Bipolar disorder, unspecified: Secondary | ICD-10-CM | POA: Diagnosis not present

## 2019-12-17 DIAGNOSIS — F4310 Post-traumatic stress disorder, unspecified: Secondary | ICD-10-CM
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
## 2019-12-18 ENCOUNTER — Telehealth: Payer: Self-pay | Admitting: Physician Assistant

## 2019-12-18 NOTE — Telephone Encounter (Signed)
Ms. Eloy End are scheduled for a virtual visit with your provider today.    Just as we do with appointments in the office, we must obtain your consent to participate.  Your consent will be active for this visit and any virtual visit you may have with one of our providers in the next 365 days.    If you have a MyChart account, I can also send a copy of this consent to you electronically.  All virtual visits are billed to your insurance company just like a traditional visit in the office.  As this is a virtual visit, video technology does not allow for your provider to perform a traditional examination.  This may limit your provider's ability to fully assess your condition.  If your provider identifies any concerns that need to be evaluated in person or the need to arrange testing such as labs, EKG, etc, we will make arrangements to do so.    Although advances in technology are sophisticated, we cannot ensure that it will always work on either your end or our end.  If the connection with a video visit is poor, we may have to switch to a telephone visit.  With either a video or telephone visit, we are not always able to ensure that we have a secure connection.   I need to obtain your verbal consent now.   Are you willing to proceed with your visit today?   Charleigh Correnti has provided verbal consent on 12/18/2019 for a virtual visit (video or telephone).   Melony Overly, PA-C 12/18/2019  5:57 PM

## 2020-01-03 ENCOUNTER — Telehealth: Payer: Self-pay | Admitting: *Deleted

## 2020-01-03 NOTE — Telephone Encounter (Addendum)
Patient has annual exam scheduled with you on 01/30/20, she called asking for recommendations. Patient has a 30 year old child, patient reports she is trying to weaning baby off breast milk and she is having trouble drying up her breast milk, reports breast are engorged. Last seen in 2019 with Harriett Sine, she is not seeking a Rx. She asked if you know of anything maybe OTC she can take or any natural ways to help with this? Please advise

## 2020-01-04 ENCOUNTER — Other Ambulatory Visit: Payer: Self-pay

## 2020-01-04 ENCOUNTER — Emergency Department (HOSPITAL_COMMUNITY)
Admission: EM | Admit: 2020-01-04 | Discharge: 2020-01-07 | Disposition: A | Discharge status: Home / Self Care | Payer: BC Managed Care – PPO | Attending: Emergency Medicine | Admitting: Emergency Medicine

## 2020-01-04 ENCOUNTER — Encounter (HOSPITAL_COMMUNITY): Payer: Self-pay | Admitting: Emergency Medicine

## 2020-01-04 ENCOUNTER — Emergency Department (HOSPITAL_COMMUNITY): Payer: BC Managed Care – PPO

## 2020-01-04 DIAGNOSIS — F419 Anxiety disorder, unspecified: Secondary | ICD-10-CM | POA: Insufficient documentation

## 2020-01-04 DIAGNOSIS — F3113 Bipolar disorder, current episode manic without psychotic features, severe: Secondary | ICD-10-CM | POA: Insufficient documentation

## 2020-01-04 DIAGNOSIS — Z79899 Other long term (current) drug therapy: Secondary | ICD-10-CM | POA: Insufficient documentation

## 2020-01-04 DIAGNOSIS — R443 Hallucinations, unspecified: Secondary | ICD-10-CM | POA: Diagnosis not present

## 2020-01-04 DIAGNOSIS — R072 Precordial pain: Secondary | ICD-10-CM

## 2020-01-04 DIAGNOSIS — Z20822 Contact with and (suspected) exposure to covid-19: Secondary | ICD-10-CM | POA: Insufficient documentation

## 2020-01-04 DIAGNOSIS — R5383 Other fatigue: Secondary | ICD-10-CM | POA: Insufficient documentation

## 2020-01-04 DIAGNOSIS — R630 Anorexia: Secondary | ICD-10-CM | POA: Insufficient documentation

## 2020-01-04 DIAGNOSIS — F410 Panic disorder [episodic paroxysmal anxiety] without agoraphobia: Secondary | ICD-10-CM

## 2020-01-04 LAB — BASIC METABOLIC PANEL
Anion gap: 12 (ref 5–15)
BUN: 7 mg/dL (ref 6–20)
CO2: 23 mmol/L (ref 22–32)
Calcium: 9.9 mg/dL (ref 8.9–10.3)
Chloride: 102 mmol/L (ref 98–111)
Creatinine, Ser: 0.8 mg/dL (ref 0.44–1.00)
GFR calc Af Amer: >60 mL/min (ref >60)
GFR calc non Af Amer: >60 mL/min (ref >60)
Glucose, Bld: 117 mg/dL — ABNORMAL HIGH (ref 70–99)
Potassium: 3.3 mmol/L — ABNORMAL LOW (ref 3.5–5.1)
Sodium: 137 mmol/L (ref 135–145)

## 2020-01-04 LAB — CBC
HCT: 44.9 % (ref 36.0–46.0)
Hemoglobin: 14.5 g/dL (ref 12.0–15.0)
MCH: 30 pg (ref 26.0–34.0)
MCHC: 32.3 g/dL (ref 30.0–36.0)
MCV: 92.8 fL (ref 80.0–100.0)
Platelets: 316 10*3/uL (ref 150–400)
RBC: 4.84 MIL/uL (ref 3.87–5.11)
RDW: 11.6 % (ref 11.5–15.5)
WBC: 7.3 10*3/uL (ref 4.0–10.5)
nRBC: 0 % (ref 0.0–0.2)

## 2020-01-04 LAB — TROPONIN I (HIGH SENSITIVITY)
Troponin I (High Sensitivity): 4 ng/L (ref <18)
Troponin I (High Sensitivity): <2 ng/L (ref <18)

## 2020-01-04 LAB — I-STAT BETA HCG BLOOD, ED (MC, WL, AP ONLY): I-stat hCG, quantitative: <5 m[IU]/mL (ref <5)

## 2020-01-04 NOTE — ED Triage Notes (Signed)
C/o anxiety x 1 week with intermittent heartburn and nausea.  Denies pain at present.

## 2020-01-05 LAB — ETHANOL: Alcohol, Ethyl (B): <10 mg/dL (ref <10)

## 2020-01-05 LAB — RAPID URINE DRUG SCREEN, HOSP PERFORMED
Amphetamines: NOT DETECTED
Barbiturates: NOT DETECTED
Benzodiazepines: NOT DETECTED
Cocaine: NOT DETECTED
Opiates: NOT DETECTED
Tetrahydrocannabinol: NOT DETECTED

## 2020-01-05 LAB — SARS CORONAVIRUS 2 BY RT PCR (HOSPITAL ORDER, PERFORMED IN ~~LOC~~ HOSPITAL LAB): SARS Coronavirus 2: NEGATIVE

## 2020-01-05 MED ORDER — LAMOTRIGINE 100 MG PO TABS: 200.0000 mg | ORAL_TABLET | Freq: Every day | ORAL | Status: DC

## 2020-01-05 MED ORDER — OLANZAPINE 10 MG IM SOLR
10.0000 mg | Freq: Two times a day (BID) | INTRAMUSCULAR | Status: DC
  Filled 2020-01-05 (×2): qty 10

## 2020-01-05 MED ORDER — NORGESTIMATE-ETH ESTRADIOL 0.25-35 MG-MCG PO TABS: 1.0000 | ORAL_TABLET | Freq: Every day | ORAL | Status: DC

## 2020-01-05 MED ORDER — OLANZAPINE 5 MG PO TABS
10.0000 mg | ORAL_TABLET | Freq: Two times a day (BID) | ORAL | Status: DC
  Administered 2020-01-05: 10 mg via ORAL
  Filled 2020-01-05 (×2): qty 1
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
## 2020-01-05 NOTE — ED Notes (Signed)
Placement Breakfast Ordered

## 2020-01-05 NOTE — Progress Notes (Signed)
Patient meets criteria for inpatient treatment. There are no available beds at [facility] currently. CSW faxed referrals to the following facilities for review:  [facility]  TTS will continue to seek bed placement.   Trula Slade, MSW, LCSW Clinical Social Worker 01/05/2020 8:31 AM

## 2020-01-05 NOTE — Telephone Encounter (Signed)
If she is trying to slowly wean she should try to either hand express or pump just enough for comfort when she is engorged but make sure to avoid over pumping or stimulating the breasts. Try to increase time between feedings, shorten the feedings, and replace feedings with a solid meal. Doing it this way will take a few weeks to decrease her supply. Stopping cold [place] can cause engorgement which can lead to mastitis.   If she is wanting to wean fast and does not have history of high blood pressure she can take Sudafed- this should not be taken while nursing but instead when she is done and trying to dry up quickly. There are teas she can buy over the counter or on amazon to drink that have natural ingredients to help decrease supply. Use cool compresses, cabbage leaves, and ibuprofen or Tylenol for breast pain.

## 2020-01-05 NOTE — BH Assessment (Addendum)
Reassessment 01/05/20:  Upon chart review: "Angela Robinson is a 29 y.o. married female who presents to [facility] ED accompanied by her husband, Angela Robinson, 01/05/20. Pt reported that she is diagnosed with Bipolar Disorder and that she is experiencing "a manic breakdown."  Pt states she "feels crazy" and that she is experiencing hallucinations, which she describes as "the sounds you hear when you have a baby.  She says she has been taking Lamictal as prescribed but does not believe it is working. She says she is has been working with a Paramedic for years, Angela Robinson, whom she is seeing via tele-health. She denies history of inpatient psychiatric treatment."  In patient's reassessment today, she displays tangential thoughts.  She has symptoms of mania as she is speaking very rapidly in one moment and calms down the next. Pt appears emotionally labile and is tearful at times. States that she is unable to concentrate and/or focus.  She has a 1.30 yr old and speaks of her child multiple times in today's assessment. She states, "I'm post-partum and everything is off in my body it's making me unstable". States, "I can't sleep at home or here because I don't feel safe". Patient with paranoid thoughts and auditory/visual hallucinations. She continues to report "sounds you hear when you have a baby". She can hear heart monitors, "door dingers" and babies crying. Her visual hallucinations are wires from monitors and lights coming "on and off".   She denies current suicidal ideation or history of suicide attempts. She denies current homicidal ideation or history of violence. She reports occasional alcohol use and denies other substance use   Pt is dressed in hospital scrubs, alert and oriented x4. Pt speaks in a clear tone, at moderate volume and normal pace. Motor behavior appears normal. Eye contact is good. Pt's mood is anxious, depressed and affect is labile. Thought process is coherent and relevant. Pt's  insight and judgment are currently limited.  Patient meets criteria for inpatient treatment. There are no available beds at [facility] currently. CSW faxed referrals out to hospitals for review.

## 2020-01-05 NOTE — ED Provider Notes (Signed)
Asked to evaluate patient by RN.  In brief, patient meets inpatient criteria per behavioral health team, TTS looking for placement.  Patient is requesting to leave.  On my evaluation, patient is calm and cooperative.  She has a withdrawal posture.  Clearly not in her right mind.  To stating she needs to leave because her baby needs to breast-feed, but then stating she is not sure if he can eat while breast-feeding.  Additionally, patient agrees that she needs to stay in the hospital, but does not seem to understand why she needs to stay in the ER.  I discussed that she will not be allowed to leave, and if she tries to leave IVC paperwork will be submitted.   Proper informed by RN that patient is 1 around the department, once again asking to leave.  She is clearly a flight risk, and husband is concerned for her safety at home.  As such, IVC paperwork submitted.  The patient has been placed in psychiatric observation due to the need to provide a safe environment for the patient while obtaining psychiatric consultation and evaluation, as well as ongoing medical and medication management to treat the patient's condition.  The patient has been placed under full IVC at this time.    Alveria Apley, PA-C 01/05/20 [id number]    Arby Barrette, MD 01/05/20 [phone]

## 2020-01-05 NOTE — ED Notes (Signed)
Pt restless but agreed to wait on stretcher.

## 2020-01-05 NOTE — ED Notes (Signed)
TC to Charge nurse to report Pt is a flight risk. Pt was assessed by [facility]  And IN Pt needs were recommended . Pt was moved to hall bed . No sitter . Pa Caccavale was notified of Pt's eval by [facility] for in Pt needs.

## 2020-01-05 NOTE — ED Notes (Signed)
Pt  Calm at present time

## 2020-01-05 NOTE — ED Notes (Signed)
Pt made a phone call to Husband. Pt tearful and reported she had not talked anyone and she wanted to go home. Pt crying during whole conversation. Pt reported to Husband she wanted to go home. Pt asked Husband how she was going to get home. Pt reports feeling trapped because she can not just walk out.

## 2020-01-05 NOTE — Progress Notes (Signed)
Reviewed at [facility] for possible 300 hall bed; however, needs reevaluation due to psychosis, which would be appropriate for a 500 hall bed. 500 hall is at capacity today.

## 2020-01-05 NOTE — ED Notes (Signed)
Pt at staff desk requesting to have family come to pick her to go home. Pt informed [facility] would be called for update.

## 2020-01-05 NOTE — ED Provider Notes (Signed)
[facility] EMERGENCY DEPARTMENT Provider Note   CSN: [id number] Arrival date & time: 01/04/20  1421     History Chief Complaint  Patient presents with  . Anxiety    Angela Robinson is a 29 y.o. female.  The history is provided by the patient and the spouse.  Anxiety This is a recurrent problem. The current episode started more than 1 week ago. The problem occurs daily. The problem has been gradually worsening. Associated symptoms include chest pain. The symptoms are aggravated by stress. Nothing relieves the symptoms.  Patient presents for anxiety as well as chest pain Patient reports for the past week she has had pain under her left breast, that she thinks is due to the fact that her breasts are engorged from attempting to wean her nursing child.  No cough, no fever, no shortness of breath. Patient also reports of recent heartburn that is improving  Patient also reports worsening anxiety.  Patient has a history of bipolar disorder and is currently on Lamictal.  However she is concerned it is not working.  She has had sleep difficulties recently and appetite change.  She reports at times she does feel like she is hallucinating and hearing things. She denies any SI, she does feel safe with the child at home She did recently move here from [place]     Past Medical History:  Diagnosis Date  . Bipolar disorder [facility]) 2010    Patient Active Problem List   Diagnosis Date Noted  . 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 Hypothyroid 05/09/2012  . History of PID 11/17/2011    Past Surgical History:  Procedure Laterality Date  . WISDOM TOOTH EXTRACTION       OB History    Gravida  0   Para      Term      Preterm      AB      Living        SAB      TAB      Ectopic      Multiple      Live Births              Family History  Problem Relation Age of Onset  . Diabetes Maternal Grandmother   . Breast  cancer Maternal Grandmother        Age60's  . Diabetes Maternal Grandfather   . Hyperlipidemia Paternal Grandmother   . Heart disease Paternal Grandmother   . Hyperlipidemia Paternal Grandfather   . Heart disease Paternal Grandfather   . Kidney failure Paternal Grandfather     Social History   Tobacco Use  . Smoking status: Never Smoker  . Smokeless tobacco: Never Used  Vaping Use  . Vaping Use: Never used  Substance Use Topics  . Alcohol use: Yes    Alcohol/week: 0.0 standard drinks    Comment: RARELY  . Drug use: No    Home Medications Prior to Admission medications   Medication Sig Start Date End Date Taking? Authorizing Provider  divalproex (DEPAKOTE ER) 500 MG 24 hr tablet Take 1 tablet by mouth daily. Patient not taking: Reported on 12/17/2019 05/21/14   [provider]  lamoTRIgine (LAMICTAL) 100 MG tablet Take 2 tablets (200 mg total) by mouth daily. 06/04/18   Cherie Ouch, PA-C  Melatonin 3-10 MG TABS Take by mouth.    [provider]  norgestimate-ethinyl estradiol (ORTHO-CYCLEN,SPRINTEC,PREVIFEM) 0.25-35 MG-MCG tablet Take 1 tablet by  mouth daily. Patient not taking: Reported on 12/17/2019 07/28/17   Harrington Challenger, NP    Allergies    Patient has no known allergies.  Review of Systems   Review of Systems  Constitutional: Positive for appetite change and fatigue. Negative for fever.  Cardiovascular: Positive for chest pain.  Psychiatric/Behavioral: Positive for hallucinations and sleep disturbance. Negative for suicidal ideas. The patient is nervous/anxious.   All other systems reviewed and are negative.   Physical Exam Updated Vital Signs BP 131/88   Pulse 65   Temp 98 F (36.7 C) (Oral)   Resp 15   LMP 12/28/2019   SpO2 98%   Physical Exam CONSTITUTIONAL: Well developed/well nourished, anxious HEAD: Normocephalic/atraumatic EYES: EOMI/PERRL ENMT: Mask in place NECK: supple no meningeal signs CV: S1/S2 noted, no  murmurs/rubs/gallops noted LUNGS: Lungs are clear to auscultation bilaterally, no apparent distress ABDOMEN: soft, nontender NEURO: Pt is awake/alert/appropriate, moves all extremitiesx4.  No facial droop.   EXTREMITIES: pulses normal/equal, full ROM SKIN: warm, color normal PSYCH: Patient appears anxious, she is easily distractible  ED Results / Procedures / Treatments   Labs (all labs ordered are listed, but only abnormal results are displayed) Labs Reviewed  BASIC METABOLIC PANEL - Abnormal; Notable for the following components:      Result Value   Potassium 3.3 (*)    Glucose, Bld 117 (*)    All other components within normal limits  CBC  RAPID URINE DRUG SCREEN, HOSP PERFORMED  ETHANOL  I-STAT BETA HCG BLOOD, ED ([facility], [facility], [facility] ONLY)  TROPONIN I (HIGH SENSITIVITY)  TROPONIN I (HIGH SENSITIVITY)    EKG EKG Interpretation  Date/Time:  Saturday January 04 2020 15:14:46 EDT Ventricular Rate:  99 PR Interval:  106 QRS Duration: 72 QT Interval:  348 QTC Calculation: 446 R Axis:   68 Text Interpretation: Sinus rhythm with short PR Nonspecific ST and T wave abnormality Abnormal ECG NO STEMI. No old tracing to compare Confirmed by Drema Pry [phone]) on 01/04/2020 11:19:48 PM   Radiology DG Chest 2 View  Result Date: 01/04/2020 CLINICAL DATA:  Chest pain.  Anxiety. EXAM: CHEST - 2 VIEW COMPARISON:  None. FINDINGS: Cardiomediastinal silhouette is normal. Mediastinal contours appear intact. There is no evidence of focal airspace consolidation, pleural effusion or pneumothorax. Osseous structures are without acute abnormality. Soft tissues are grossly normal. IMPRESSION: No active cardiopulmonary disease. Electronically Signed   By: Ted Mcalpine M.D.   On: 01/04/2020 18:13    Procedures Procedures  Medications Ordered in ED Medications - No data to display  ED Course  I have reviewed the triage vital signs and the nursing notes.  Pertinent labs & imaging results  that were available during my care of the patient were reviewed by me and considered in my medical decision making (see chart for details).    MDM Rules/Calculators/A&P                          12:19 AM Patient presented with 2 issues.  She reports her chest wall has been sore recently due to engorgement of her breast after trying to stop breast-feeding Labs, EKG, chest x-ray are reassuring.  She is not currently on contraceptives. I have low suspicion for ACS/PE/dissection at this time.  Patient also reports increase in anxiety. She recently moved here from [place].  She feels that her medications are not working. Once her husband arrived to the room, he reports the patient has reported she  has not felt safe and she feels that something is going to happen to her at home.  She also reports having auditory hallucinations of noises.  During exam, she displays echolalia with her husband.  Patient is easily distractible and clearly anxious. Patient reports a recent telemedicine visit with behavioral health, and supposed to have an outpatient appointment next week. Will consult behavioral health at this time 3:42 AM D/w [facility] provider Plan to perform TTS evaluation here in the ED Pt otherwise medically stable at this time  Final Clinical Impression(s) / ED Diagnoses Final diagnoses:  Anxiety attack  Precordial pain    Rx / DC Orders ED Discharge Orders    None       Zadie Rhine, MD 01/05/20 [phone]

## 2020-01-05 NOTE — ED Notes (Signed)
2 bags locker 3

## 2020-01-05 NOTE — Progress Notes (Addendum)
Pt is in review at [facility].   Pt also in review at [facility]. They are requesting IVC paperwork once patient is served and an update on her behaviors/medication compliance this afternoon.   Christophere Hillhouse S. Alan Ripper, MSW, LCSW Clinical Social Worker 01/05/2020 10:31 AM

## 2020-01-05 NOTE — ED Notes (Signed)
[phone] connor husband would like an update.

## 2020-01-06 MED ORDER — LAMOTRIGINE 100 MG PO TABS
200.0000 mg | ORAL_TABLET | Freq: Every day | ORAL | Status: DC
  Administered 2020-01-06 – 2020-01-07 (×2): 200 mg via ORAL
  Filled 2020-01-06 (×2): qty 2

## 2020-01-06 MED ORDER — QUETIAPINE FUMARATE 50 MG PO TABS
50.0000 mg | ORAL_TABLET | Freq: Every day | ORAL | Status: DC
  Administered 2020-01-06 (×2): 50 mg via ORAL
  Filled 2020-01-06: qty 1

## 2020-01-06 NOTE — ED Notes (Signed)
Pt reports she had a baby boy a year ago. No complaints but tells sitter she cannot take shower bc she cannot walk.

## 2020-01-06 NOTE — ED Notes (Signed)
Pt provided lunch tray.

## 2020-01-06 NOTE — BH Assessment (Addendum)
Per staff at [facility]), patient has been accepted to [facility] campus for admission 01/07/2020. Anytime after 9am.The accepting provider is Dr. Estill Cotta. Nurse Report [phone].

## 2020-01-06 NOTE — ED Notes (Signed)
Pt states she needs to express her breasts. RN called [employer] and handpump sent down. Rn gave to patient and also got her heat pads per her request. Privacy given

## 2020-01-06 NOTE — ED Notes (Signed)
Pt is crying at this time. Mother called but she says she does not want to talk to her.

## 2020-01-06 NOTE — ED Provider Notes (Signed)
Emergency Medicine Observation Re-evaluation Note  Angela Robinson is a 29 y.o. female, seen on rounds today.  Pt initially presented to the ED for complaints of Anxiety Currently, the patient is resting on the bed in the hallway.  Physical Exam  BP (!) 96/57 (BP Location: Left Arm)   Pulse 93   Temp 98.6 F (37 C) (Oral)   Resp 18   LMP 12/28/2019   SpO2 99%  Physical Exam General: resting comfortably Cardiac: well perfused Lungs: even and unlabored3 Psych: calm  ED Course / MDM  EKG:EKG Interpretation  Date/Time:  Saturday January 04 2020 15:14:46 EDT Ventricular Rate:  99 PR Interval:  106 QRS Duration: 72 QT Interval:  348 QTC Calculation: 446 R Axis:   68 Text Interpretation: Sinus rhythm with short PR Nonspecific ST and T wave abnormality Abnormal ECG NO STEMI. No old tracing to compare Confirmed by Drema Pry [phone]) on 01/04/2020 11:19:48 PM    I have reviewed the labs performed to date as well as medications administered while in observation.  Recent changes in the last 24 hours include psych is working on placement. Per note yesterday "Patient meets criteria for inpatient treatment. There are no available beds at [facility] currently. CSW faxed referrals out to hospitals for review".  Plan  Current plan is for in patient placement. Patient is under full IVC at this time.   Angela Loll, MD 01/06/20 [phone]

## 2020-01-06 NOTE — ED Notes (Signed)
TTS machine at bedside. 

## 2020-01-06 NOTE — Progress Notes (Addendum)
Patient ID: Angela Robinson, female   DOB: 1990/06/29, 29 y.o.   MRN: [id number]   Psychiatric Reassessment   HPI: Upon chart review: "Angela Robinson is a 30 y.o. married female who presents to [facility] ED accompanied by her husband, Yamel Bale, 01/05/20. Pt reported that she is diagnosed with Bipolar Disorder and that she is experiencing "a manic breakdown."  Pt states she "feels crazy" and that she is experiencing hallucinations, which she describes as "the sounds you hear when you have a baby.  She says she has been taking Lamictal as prescribed but does not believe it is working. She says she is has been working with a Paramedic for years, Melony Overly, whom she is seeing via tele-health. She denies history of inpatient psychiatric treatment."  Psychiatric Re-evaluation: Angela Robinson is a 29 y.o. female who presented to [facility] following reports of mania, post partum depression, paranoid thoughts and auditory/visual hallucinations described as "sounds you hear when you have a baby". She can hear heart monitors, "door dingers" and babies crying. Her visual hallucinations are wires from monitors and lights coming "on and off". She has a psychiatric diagnosis of Bipolar disorder. During this evaluation, she is alert and oriented x4, calm and cooperative.   She denies current suicidal thoughts with plan or intent and homicidal thoughts. In regard to hallucinations she stated," not as much." When asked to describe the hallucinations she stated," you know like hearing voices telling me to take care of myself." She then mentioned feeling numb like," you know when you are sitting still and the blood in your body is not moving."  Her thoughts were at some points, tangentials and her mood labile. She states that she is currently prescribed Lamictal and Cymbalta and states that these medications have been restarted while in the ED. She states that she feels less manic as she is on these medications  and she states that she felt like  The medications were effective although per  review of chart, patient has not started these medications  Per patients husband, patient does not take Cymbalta although she is prescribed Lamictal 200 mg daily. She reports she slept well last night although she has been experience  insomnia. Reports one prior  suicide attempt while in high school and one prior psychiatric hospitalization. Reports she has been receiving medication management and therapy through [facility].   With verbal consent, she stated that I could speak to her husband Cherith Tewell [phone]. Per spouse, patient has had at least two episodes of manic behaviors. He stated that patient is hallucinating saying that she hears voices and very paranoid. Stated that patient," freaks out" just by turning on the lights. Stated that she crys constantly and she is afraid to be around the kids.Stated patient has not been sleeping and texting people in the middle of the night to  Come and get her. Stated when they presented to the ED, she ran out 6 times and on the 7th time, they had to get assistance by security. Stated that he does fear that she will harm herself. And does not think it would be safe to be discharged.  Confirmed that patient is on Lamictal 200 mg daily but stated that the Cymbalta that patient referenced was not her and in fact, his mothers.   Disposition  Based off of my evaluation and collateral information, I am continuing to recommend inpatient. I will restart Lamictal 200 mg po daily and add Seroquel 50 mg po daily  at bedtime for symptoms of insomnia, depression, and psychosis. Zyprexa discontinued as Seroquel will be initiated. I will check with [facility] to see if there are appropriate beds available. If so, [facility] will update the ED with bed assignment and time that patient can be transferred, If not, CSW will refer patient to other facilities. Patient is COVID negative.

## 2020-01-06 NOTE — ED Notes (Signed)
Pt provided with a breakfast tray.  

## 2020-01-07 NOTE — ED Provider Notes (Signed)
Patient accepted to [facility].  She is sitting up in NAD.   Gerhard Munch, MD 01/07/20 1114

## 2020-01-07 NOTE — Telephone Encounter (Signed)
Left message for patient to call.

## 2020-01-09 ENCOUNTER — Telehealth: Payer: BC Managed Care – PPO | Admitting: Physician Assistant

## 2020-01-10 NOTE — Telephone Encounter (Signed)
Left message to call.

## 2020-01-13 NOTE — Progress Notes (Signed)
Opened in error

## 2020-01-23 ENCOUNTER — Ambulatory Visit (INDEPENDENT_AMBULATORY_CARE_PROVIDER_SITE_OTHER): Payer: BC Managed Care – PPO | Admitting: Adult Health

## 2020-01-23 ENCOUNTER — Other Ambulatory Visit: Payer: Self-pay

## 2020-01-23 ENCOUNTER — Encounter: Payer: Self-pay | Admitting: Adult Health

## 2020-01-23 DIAGNOSIS — F411 Generalized anxiety disorder: Secondary | ICD-10-CM | POA: Diagnosis not present

## 2020-01-23 DIAGNOSIS — F319 Bipolar disorder, unspecified: Secondary | ICD-10-CM | POA: Diagnosis not present

## 2020-01-23 DIAGNOSIS — G4700 Insomnia, unspecified: Secondary | ICD-10-CM | POA: Diagnosis not present

## 2020-01-23 DIAGNOSIS — F410 Panic disorder [episodic paroxysmal anxiety] without agoraphobia: Secondary | ICD-10-CM

## 2020-01-23 DIAGNOSIS — F4310 Post-traumatic stress disorder, unspecified: Secondary | ICD-10-CM

## 2020-01-23 MED ORDER — LAMOTRIGINE 200 MG PO TABS: 200.0000 mg | ORAL_TABLET | Freq: Every day | ORAL | 2 refills | Status: DC

## 2020-01-23 MED ORDER — HYDROXYZINE PAMOATE 25 MG PO CAPS: 25.0000 mg | ORAL_CAPSULE | Freq: Four times a day (QID) | ORAL | 2 refills | Status: DC | PRN

## 2020-01-23 MED ORDER — QUETIAPINE FUMARATE 100 MG PO TABS: 100.0000 mg | ORAL_TABLET | Freq: Every day | ORAL | 2 refills | Status: DC

## 2020-01-23 MED ORDER — OXCARBAZEPINE 300 MG PO TABS: 300.0000 mg | ORAL_TABLET | Freq: Two times a day (BID) | ORAL | 2 refills | Status: DC

## 2020-01-23 NOTE — Progress Notes (Signed)
Angela Robinson [id number] 11-30-1990 29 y.o.  Subjective:   Patient ID:  Angela Robinson is a 29 y.o. (DOB 07-24-1990) female.  Chief Complaint: No chief complaint on file.   HPI   Accompanied by husband.   Violia Knopf presents to the office today for follow-up of BPD, GAD, panic attacks and PTSD.  Describes mood today as "ok". Pleasant. Communicative. Mood symptoms - reports decreased depression, anxiety, and irritability. Denies mania or hallucinations. Recently hospitalized for manic episode for a total of 12 days - 3 days at [facility] and 9 days at [facility]. She and husband feel like she is doing better since hospitalization. Improved interest and motivation. Taking medications as prescribed. Plans to see a therapist. Reports issues from childhood traumatic experiences. Energy levels "good". Active, does not have a regular exercise routine.  Enjoys some usual interests and activities. Married. Lives with husband of 3 years and 70 month old son. She and family moved back from [place] a month ago. Spending time with family. Appetite adequate. Weight loss 115 pounds in August - now 109 pounds. Sleeps well most nights. Averages 8 to 10 hours. Focus and concentration "difficulties". Having "memory issues".  Completing tasks. Managing aspects of household. Stay at home mom.  Denies SI or HI.  Denies AH or VH.  Previous medication trials:   Review of Systems:  Review of Systems  Musculoskeletal: Negative for gait problem.  Neurological: Negative for tremors.  Psychiatric/Behavioral:       Please refer to HPI    Medications: I have reviewed the patient's current medications.  Current Outpatient Medications  Medication Sig Dispense Refill  . hydrOXYzine (VISTARIL) 25 MG capsule Take 1 capsule (25 mg total) by mouth every 6 (six) hours as needed. 120 capsule 2  . lamoTRIgine (LAMICTAL) 200 MG tablet Take 1 tablet (200 mg total) by mouth daily. 30 tablet 2  . Oxcarbazepine  (TRILEPTAL) 300 MG tablet Take 1 tablet (300 mg total) by mouth 2 (two) times daily. 60 tablet 2  . QUEtiapine (SEROQUEL) 100 MG tablet Take 1 tablet (100 mg total) by mouth at bedtime. 30 tablet 2   No current facility-administered medications for this visit.    Medication Side Effects: None  Allergies: No Known Allergies  Past Medical History:  Diagnosis Date  . Bipolar disorder (HCC) 2010    Family History  Problem Relation Age of Onset  . Diabetes Maternal Grandmother   . Breast cancer Maternal Grandmother        Age60's  . Diabetes Maternal Grandfather   . Hyperlipidemia Paternal Grandmother   . Heart disease Paternal Grandmother   . Hyperlipidemia Paternal Grandfather   . Heart disease Paternal Grandfather   . Kidney failure Paternal Grandfather     Social History   Socioeconomic History  . Marital status: Single    Spouse name: Not on file  . Number of children: Not on file  . Years of education: Not on file  . Highest education level: Not on file  Occupational History  . Not on file  Tobacco Use  . Smoking status: Never Smoker  . Smokeless tobacco: Never Used  Vaping Use  . Vaping Use: Never used  Substance and Sexual Activity  . Alcohol use: Yes    Alcohol/week: 0.0 standard drinks    Comment: RARELY  . Drug use: No  . Sexual activity: Yes    Birth control/protection: Pill    Comment: intercourse age 46, declined sexual partners question  Other  Topics Concern  . Not on file  Social History Narrative  . Not on file   Social Determinants of Health   Financial Resource Strain:   . Difficulty of Paying Living Expenses: Not on file  Food Insecurity:   . Worried About Programme researcher, broadcasting/film/video in the Last Year: Not on file  . Ran Out of Food in the Last Year: Not on file  Transportation Needs:   . Lack of Transportation (Medical): Not on file  . Lack of Transportation (Non-Medical): Not on file  Physical Activity:   . Days of Exercise per Week: Not on  file  . Minutes of Exercise per Session: Not on file  Stress:   . Feeling of Stress : Not on file  Social Connections:   . Frequency of Communication with Friends and Family: Not on file  . Frequency of Social Gatherings with Friends and Family: Not on file  . Attends Religious Services: Not on file  . Active Member of Clubs or Organizations: Not on file  . Attends Banker Meetings: Not on file  . Marital Status: Not on file  Intimate Partner Violence:   . Fear of Current or Ex-Partner: Not on file  . Emotionally Abused: Not on file  . Physically Abused: Not on file  . Sexually Abused: Not on file    Past Medical History, Surgical history, Social history, and Family history were reviewed and updated as appropriate.   Please see review of systems for further details on the patient's review from today.   Objective:   Physical Exam:  LMP 12/28/2019   Physical Exam Constitutional:      General: She is not in acute distress. Musculoskeletal:        General: No deformity.  Neurological:     Mental Status: She is alert and oriented to person, place, and time.     Coordination: Coordination normal.  Psychiatric:        Attention and Perception: Attention and perception normal. She does not perceive auditory or visual hallucinations.        Mood and Affect: Mood normal. Mood is not anxious or depressed. Affect is not labile, blunt, angry or inappropriate.        Speech: Speech normal.        Behavior: Behavior normal.        Thought Content: Thought content normal. Thought content is not paranoid or delusional. Thought content does not include homicidal or suicidal ideation. Thought content does not include homicidal or suicidal plan.        Cognition and Memory: Cognition and memory normal.        Judgment: Judgment normal.     Comments: Insight intact     Lab Review:     Component Value Date/Time   NA 137 01/04/2020 1526   NA 140 09/30/2011 1056   K 3.3 (L)  01/04/2020 1526   K 3.5 09/30/2011 1056   CL 102 01/04/2020 1526   CL 106 09/30/2011 1056   CO2 23 01/04/2020 1526   CO2 26 09/30/2011 1056   GLUCOSE 117 (H) 01/04/2020 1526   GLUCOSE 103 (H) 09/30/2011 1056   BUN 7 01/04/2020 1526   BUN 14 09/30/2011 1056   CREATININE 0.80 01/04/2020 1526   CREATININE 0.76 06/02/2015 1251   CALCIUM 9.9 01/04/2020 1526   CALCIUM 8.7 09/30/2011 1056   PROT 6.5 06/02/2015 1251   PROT 7.0 09/30/2011 1056   ALBUMIN 3.3 (L) 06/02/2015 1251  ALBUMIN 3.0 (L) 09/30/2011 1056   AST 14 06/02/2015 1251   AST 22 09/30/2011 1056   ALT 7 06/02/2015 1251   ALT 13 09/30/2011 1056   ALKPHOS 34 06/02/2015 1251   ALKPHOS 41 (L) 09/30/2011 1056   BILITOT 0.5 06/02/2015 1251   BILITOT 0.4 09/30/2011 1056   GFRNONAA >60 01/04/2020 1526   GFRNONAA >60 09/30/2011 1056   GFRAA >60 01/04/2020 1526   GFRAA >60 09/30/2011 1056       Component Value Date/Time   WBC 7.3 01/04/2020 1526   RBC 4.84 01/04/2020 1526   HGB 14.5 01/04/2020 1526   HGB 12.3 09/30/2011 1056   HCT 44.9 01/04/2020 1526   HCT 37.9 09/30/2011 1056   PLT 316 01/04/2020 1526   PLT 156 09/30/2011 1056   MCV 92.8 01/04/2020 1526   MCV 88 09/30/2011 1056   MCH 30.0 01/04/2020 1526   MCHC 32.3 01/04/2020 1526   RDW 11.6 01/04/2020 1526   RDW 14.1 09/30/2011 1056   LYMPHSABS 1,866 07/25/2017 1123   MONOABS 414 06/02/2016 1220   EOSABS 62 07/25/2017 1123   BASOSABS 29 07/25/2017 1123    No results found for: POCLITH, LITHIUM   No results found for: PHENYTOIN, PHENOBARB, VALPROATE, CBMZ   .res Assessment: Plan:    Plan:  PDMP reviewed  1. 2. 3.  Read and reviewed note with patient for accuracy.   RTC 4 weeks  Patient advised to contact office with any questions, adverse effects, or acute worsening in signs and symptoms.    Diagnoses and all orders for this visit:  PTSD (post-traumatic stress disorder)  Bipolar depression (HCC) -     lamoTRIgine (LAMICTAL) 200 MG  tablet; Take 1 tablet (200 mg total) by mouth daily. -     Oxcarbazepine (TRILEPTAL) 300 MG tablet; Take 1 tablet (300 mg total) by mouth 2 (two) times daily. -     QUEtiapine (SEROQUEL) 100 MG tablet; Take 1 tablet (100 mg total) by mouth at bedtime.  Insomnia, unspecified type -     hydrOXYzine (VISTARIL) 25 MG capsule; Take 1 capsule (25 mg total) by mouth every 6 (six) hours as needed.  Generalized anxiety disorder -     hydrOXYzine (VISTARIL) 25 MG capsule; Take 1 capsule (25 mg total) by mouth every 6 (six) hours as needed.  Panic attacks -     hydrOXYzine (VISTARIL) 25 MG capsule; Take 1 capsule (25 mg total) by mouth every 6 (six) hours as needed.     Please see After Visit Summary for patient specific instructions.  Future Appointments  Date Time Provider Department Center  01/30/2020 11:00 AM Olivia Mackie, NP [facility]  02/26/2020  9:30 AM Melony Overly T, PA-C [facility] None    No orders of the defined types were placed in this encounter.   -------------------------------

## 2020-01-30 ENCOUNTER — Encounter: Payer: Self-pay | Admitting: Nurse Practitioner

## 2020-01-30 ENCOUNTER — Other Ambulatory Visit: Payer: Self-pay

## 2020-01-30 ENCOUNTER — Ambulatory Visit (INDEPENDENT_AMBULATORY_CARE_PROVIDER_SITE_OTHER): Payer: BC Managed Care – PPO | Admitting: Nurse Practitioner

## 2020-01-30 VITALS — BP 110/78 | Ht 63.0 in | Wt 111.0 lb

## 2020-01-30 DIAGNOSIS — Z01419 Encounter for gynecological examination (general) (routine) without abnormal findings: Secondary | ICD-10-CM | POA: Diagnosis not present

## 2020-01-30 DIAGNOSIS — E039 Hypothyroidism, unspecified: Secondary | ICD-10-CM

## 2020-01-30 DIAGNOSIS — Z30011 Encounter for initial prescription of contraceptive pills: Secondary | ICD-10-CM

## 2020-01-30 LAB — TSH: TSH: 1.92 mIU/L

## 2020-01-30 MED ORDER — NORGESTIMATE-ETH ESTRADIOL 0.25-35 MG-MCG PO TABS: 1.0000 | ORAL_TABLET | Freq: Every day | ORAL | 4 refills | Status: AC

## 2020-01-30 NOTE — Patient Instructions (Addendum)
Health Maintenance, Female Adopting a healthy lifestyle and getting preventive care are important in promoting health and wellness. Ask your health care provider about:  The right schedule for you to have regular tests and exams.  Things you can do on your own to prevent diseases and keep yourself healthy. What should I know about diet, weight, and exercise? Eat a healthy diet   Eat a diet that includes plenty of vegetables, fruits, low-fat dairy products, and lean protein.  Do not eat a lot of foods that are high in solid fats, added sugars, or sodium. Maintain a healthy weight Body mass index (BMI) is used to identify weight problems. It estimates body fat based on height and weight. Your health care provider can help determine your BMI and help you achieve or maintain a healthy weight. Get regular exercise Get regular exercise. This is one of the most important things you can do for your health. Most adults should:  Exercise for at least 150 minutes each week. The exercise should increase your heart rate and make you sweat (moderate-intensity exercise).  Do strengthening exercises at least twice a week. This is in addition to the moderate-intensity exercise.  Spend less time sitting. Even light physical activity can be beneficial. Watch cholesterol and blood lipids Have your blood tested for lipids and cholesterol at 30 years of age, then have this test every 5 years. Have your cholesterol levels checked more often if:  Your lipid or cholesterol levels are high.  You are older than 30 years of age.  You are at high risk for heart disease. What should I know about cancer screening? Depending on your health history and family history, you may need to have cancer screening at various ages. This may include screening for:  Breast cancer.  Cervical cancer.  Colorectal cancer.  Skin cancer.  Lung cancer. What should I know about heart disease, diabetes, and high blood  pressure? Blood pressure and heart disease  High blood pressure causes heart disease and increases the risk of stroke. This is more likely to develop in people who have high blood pressure readings, are of African descent, or are overweight.  Have your blood pressure checked: ? Every 3-5 years if you are 18-39 years of age. ? Every year if you are 40 years old or older. Diabetes Have regular diabetes screenings. This checks your fasting blood sugar level. Have the screening done:  Once every three years after age 40 if you are at a normal weight and have a low risk for diabetes.  More often and at a younger age if you are overweight or have a high risk for diabetes. What should I know about preventing infection? Hepatitis B If you have a higher risk for hepatitis B, you should be screened for this virus. Talk with your health care provider to find out if you are at risk for hepatitis B infection. Hepatitis C Testing is recommended for:  Everyone born from 1945 through 1965.  Anyone with known risk factors for hepatitis C. Sexually transmitted infections (STIs)  Get screened for STIs, including gonorrhea and chlamydia, if: ? You are sexually active and are younger than 30 years of age. ? You are older than 30 years of age and your health care provider tells you that you are at risk for this type of infection. ? Your sexual activity has changed since you were last screened, and you are at increased risk for chlamydia or gonorrhea. Ask your health care provider if   you are at risk.  Ask your health care provider about whether you are at high risk for HIV. Your health care provider may recommend a prescription medicine to help prevent HIV infection. If you choose to take medicine to prevent HIV, you should first get tested for HIV. You should then be tested every 3 months for as long as you are taking the medicine. Pregnancy  If you are about to stop having your period (premenopausal) and  you may become pregnant, seek counseling before you get pregnant.  Take 400 to 800 micrograms (mcg) of folic acid every day if you become pregnant.  Ask for birth control (contraception) if you want to prevent pregnancy. Osteoporosis and menopause Osteoporosis is a disease in which the bones lose minerals and strength with aging. This can result in bone fractures. If you are 65 years old or older, or if you are at risk for osteoporosis and fractures, ask your health care provider if you should:  Be screened for bone loss.  Take a calcium or vitamin D supplement to lower your risk of fractures.  Be given hormone replacement therapy (HRT) to treat symptoms of menopause. Follow these instructions at home: Lifestyle  Do not use any products that contain nicotine or tobacco, such as cigarettes, e-cigarettes, and chewing tobacco. If you need help quitting, ask your health care provider.  Do not use street drugs.  Do not share needles.  Ask your health care provider for help if you need support or information about quitting drugs. Alcohol use  Do not drink alcohol if: ? Your health care provider tells you not to drink. ? You are pregnant, may be pregnant, or are planning to become pregnant.  If you drink alcohol: ? Limit how much you use to 0-1 drink a day. ? Limit intake if you are breastfeeding.  Be aware of how much alcohol is in your drink. In the [place] one drink equals one 12 oz bottle of beer (355 mL), one 5 oz glass of wine (148 mL), or one 1 oz glass of hard liquor (44 mL). General instructions  Schedule regular health, dental, and eye exams.  Stay current with your vaccines.  Tell your health care provider if: ? You often feel depressed. ? You have ever been abused or do not feel safe at home. Summary  Adopting a healthy lifestyle and getting preventive care are important in promoting health and wellness.  Follow your health care provider's instructions about healthy  diet, exercising, and getting tested or screened for diseases.  Follow your health care provider's instructions on monitoring your cholesterol and blood pressure. This information is not intended to replace advice given to you by your health care provider. Make sure you discuss any questions you have with your health care provider. Document Revised: 04/11/2018 Document Reviewed: 04/11/2018 Elsevier Patient Education  2020 Elsevier Inc.  Oral Contraception Use Oral contraceptive pills (OCPs) are medicines that you take to prevent pregnancy. OCPs work by:  Preventing the ovaries from releasing eggs.  Thickening mucus in the lower part of the uterus (cervix), which prevents sperm from entering the uterus.  Thinning the lining of the uterus (endometrium), which prevents a fertilized egg from attaching to the endometrium. OCPs are highly effective when taken exactly as prescribed. However, OCPs do not prevent sexually transmitted infections (STIs). Safe sex practices, such as using condoms while on an OCP, can help prevent STIs. Before taking OCPs, you may have a physical exam, blood test, and Pap test.   A Pap test involves taking a sample of cells from your cervix to check for cancer. Discuss with your health care provider the possible side effects of the OCP you may be prescribed. When you start an OCP, be aware that it can take 2-3 months for your body to adjust to changes in hormone levels. How to take oral contraceptive pills Follow instructions from your health care provider about how to start taking your first cycle of OCPs. Your health care provider may recommend that you:  Start the pill on day 1 of your menstrual period. If you start at this time, you will not need any backup form of birth control (contraception), such as condoms.  Start the pill on the first Sunday after your menstrual period or on the day you get your prescription. In these cases, you will need to use backup  contraception for the first week.  Start the pill at any time of your cycle. ? If you take the pill within 5 days of the start of your period, you will not need a backup form of contraception. ? If you start at any other time of your menstrual cycle, you will need to use another form of contraception for 7 days. If your OCP is the type called a minipill, it will protect you from pregnancy after taking it for 2 days (48 hours), and you can stop using backup contraception after that time. After you have started taking OCPs:  If you forget to take 1 pill, take it as soon as you remember. Take the next pill at the regular time.  If you miss 2 or more pills, call your health care provider. Different pills have different instructions for missed doses. Use backup birth control until your next menstrual period starts.  If you use a 28-day pack that contains inactive pills and you miss 1 of the last 7 pills (pills with no hormones), throw away the rest of the non-hormone pills and start a new pill pack. No matter which day you start the OCP, you will always start a new pack on that same day of the week. Have an extra pack of OCPs and a backup contraceptive method available in case you miss some pills or lose your OCP pack. Follow these instructions at home:  Do not use any products that contain nicotine or tobacco, such as cigarettes and e-cigarettes. If you need help quitting, ask your health care provider.  Always use a condom to protect against STIs. OCPs do not protect against STIs.  Use a calendar to mark the days of your menstrual period.  Read the information and directions that came with your OCP. Talk to your health care provider if you have questions. Contact a health care provider if:  You develop nausea and vomiting.  You have abnormal vaginal discharge or bleeding.  You develop a rash.  You miss your menstrual period. Depending on the type of OCP you are taking, this may be a sign  of pregnancy. Ask your health care provider for more information.  You are losing your hair.  You need treatment for mood swings or depression.  You get dizzy when taking the OCP.  You develop acne after taking the OCP.  You become pregnant or think you may be pregnant.  You have diarrhea, constipation, and abdominal pain or cramps.  You miss 2 or more pills. Get help right away if:  You develop chest pain.  You develop shortness of breath.  You have an   uncontrolled or severe headache.  You develop numbness or slurred speech.  You develop visual or speech problems.  You develop pain, redness, and swelling in your legs.  You develop weakness or numbness in your arms or legs. Summary  Oral contraceptive pills (OCPs) are medicines that you take to prevent pregnancy.  OCPs do not prevent sexually transmitted infections (STIs). Always use a condom to protect against STIs.  When you start an OCP, be aware that it can take 2-3 months for your body to adjust to changes in hormone levels.  Read all the information and directions that come with your OCP. This information is not intended to replace advice given to you by your health care provider. Make sure you discuss any questions you have with your health care provider. Document Revised: 08/10/2018 Document Reviewed: 05/30/2016 Elsevier Patient Education  2020 Elsevier Inc.  

## 2020-01-30 NOTE — Progress Notes (Signed)
   Angela Robinson 14-Jan-1991 [id number]   History:  29 y.o. G1P0001 presents for annual exam without GYN complaints. She would like to restart OCPs as she did well on these in the past and plans to try to conceive in the next year. History of hypothyroidism in her teens, not on any hormones. Bipolar, anxiety/depression managed by psych. Has 54 month old son, recently weaned from breastfeeding.   Gynecologic History Patient's last menstrual period was 01/26/2020. Period Cycle (Days): 28 Period Duration (Days): 5 Period Pattern: Regular Menstrual Flow: Moderate Menstrual Control: Maxi pad, Tampon Dysmenorrhea: (!) Mild Dysmenorrhea Symptoms: Cramping Contraception: none Last Pap: 12/2018 (in [place]). Results were: normal   Past medical history, past surgical history, family history and social history were all reviewed and documented in the EPIC chart.  ROS:  A ROS was performed and pertinent positives and negatives are included.  Exam:  Vitals:   01/30/20 1118  BP: 110/78  Weight: 111 lb (50.3 kg)  Height: 5\' 3"  (1.6 m)   Body mass index is 19.66 kg/m.  General appearance:  Normal Thyroid:  Symmetrical, normal in size, without palpable masses or nodularity. Respiratory  Auscultation:  Clear without wheezing or rhonchi Cardiovascular  Auscultation:  Regular rate, without rubs, murmurs or gallops  Edema/varicosities:  Not grossly evident Abdominal  Soft,nontender, without masses, guarding or rebound.  Liver/spleen:  No organomegaly noted  Hernia:  None appreciated  Skin  Inspection:  Grossly normal   Breasts: Examined lying and sitting. Left pectus carinatum   Right: Without masses, retractions, discharge or axillary adenopathy.   Left: Without masses, retractions, discharge or axillary adenopathy. Gentitourinary   Inguinal/mons:  Normal without inguinal adenopathy  External genitalia:  Normal  BUS/Urethra/Skene's glands:  Normal  Vagina:  Normal, slight blood  from menses  Cervix:  Normal  Uterus:  Anteverted, normal in size, shape and contour.  Midline and mobile  Adnexa/parametria:     Rt: Without masses or tenderness.   Lt: Without masses or tenderness.  Anus and perineum: Normal   Assessment/Plan:  29 y.o. G1P0001 for annual exam.   Well female exam with routine gynecological exam - Education provided on SBEs, importance of preventative screenings, current guidelines, high calcium diet, regular exercise, and multivitamin daily.   Hypothyroidism, unspecified type - Plan: TSH  Encounter for initial prescription of contraceptive pills - Plan: norgestimate-ethinyl estradiol (ORTHO-CYCLEN) 0.25-35 MG-MCG tablet. Educated on proper use and slight risk for blood clots. Trileptal does increase her risk of contraception failure so backup contraception is recommended.  Follow-up in 1 year for annual      01-16-1999 [facility], 11:37 AM 01/30/2020

## 2020-02-14 ENCOUNTER — Telehealth: Payer: Self-pay | Admitting: Physician Assistant

## 2020-02-14 ENCOUNTER — Encounter: Payer: Self-pay | Admitting: Emergency Medicine

## 2020-02-14 ENCOUNTER — Other Ambulatory Visit: Payer: Self-pay

## 2020-02-14 ENCOUNTER — Emergency Department
Admission: EM | Admit: 2020-02-14 | Discharge: 2020-02-17 | Disposition: A | Discharge status: Home / Self Care | Payer: BC Managed Care – PPO | Attending: Emergency Medicine | Admitting: Emergency Medicine

## 2020-02-14 DIAGNOSIS — F319 Bipolar disorder, unspecified: Secondary | ICD-10-CM

## 2020-02-14 DIAGNOSIS — R4182 Altered mental status, unspecified: Secondary | ICD-10-CM | POA: Insufficient documentation

## 2020-02-14 DIAGNOSIS — F410 Panic disorder [episodic paroxysmal anxiety] without agoraphobia: Secondary | ICD-10-CM

## 2020-02-14 DIAGNOSIS — F29 Unspecified psychosis not due to a substance or known physiological condition: Secondary | ICD-10-CM

## 2020-02-14 DIAGNOSIS — Z20822 Contact with and (suspected) exposure to covid-19: Secondary | ICD-10-CM | POA: Insufficient documentation

## 2020-02-14 DIAGNOSIS — F411 Generalized anxiety disorder: Secondary | ICD-10-CM

## 2020-02-14 DIAGNOSIS — E039 Hypothyroidism, unspecified: Secondary | ICD-10-CM | POA: Insufficient documentation

## 2020-02-14 DIAGNOSIS — G4700 Insomnia, unspecified: Secondary | ICD-10-CM

## 2020-02-14 LAB — COMPREHENSIVE METABOLIC PANEL
ALT: 20 U/L (ref 0–44)
AST: 25 U/L (ref 15–41)
Albumin: 4.9 g/dL (ref 3.5–5.0)
Alkaline Phosphatase: 70 U/L (ref 38–126)
Anion gap: 12 (ref 5–15)
BUN: 17 mg/dL (ref 6–20)
CO2: 23 mmol/L (ref 22–32)
Calcium: 9.8 mg/dL (ref 8.9–10.3)
Chloride: 103 mmol/L (ref 98–111)
Creatinine, Ser: 0.79 mg/dL (ref 0.44–1.00)
GFR, Estimated: >60 mL/min (ref >60)
Glucose, Bld: 114 mg/dL — ABNORMAL HIGH (ref 70–99)
Potassium: 3.9 mmol/L (ref 3.5–5.1)
Sodium: 138 mmol/L (ref 135–145)
Total Bilirubin: 0.9 mg/dL (ref 0.3–1.2)
Total Protein: 8.7 g/dL — ABNORMAL HIGH (ref 6.5–8.1)

## 2020-02-14 LAB — CBC
HCT: 44.4 % (ref 36.0–46.0)
Hemoglobin: 15.3 g/dL — ABNORMAL HIGH (ref 12.0–15.0)
MCH: 30.7 pg (ref 26.0–34.0)
MCHC: 34.5 g/dL (ref 30.0–36.0)
MCV: 89.2 fL (ref 80.0–100.0)
Platelets: 332 10*3/uL (ref 150–400)
RBC: 4.98 MIL/uL (ref 3.87–5.11)
RDW: 11.7 % (ref 11.5–15.5)
WBC: 9.4 10*3/uL (ref 4.0–10.5)
nRBC: 0 % (ref 0.0–0.2)

## 2020-02-14 LAB — ETHANOL: Alcohol, Ethyl (B): <10 mg/dL (ref <10)

## 2020-02-14 LAB — RESPIRATORY PANEL BY RT PCR (FLU A&B, COVID)
Influenza A by PCR: NEGATIVE
Influenza B by PCR: NEGATIVE
SARS Coronavirus 2 by RT PCR: NEGATIVE

## 2020-02-14 MED ORDER — OXCARBAZEPINE 300 MG PO TABS
300.0000 mg | ORAL_TABLET | Freq: Two times a day (BID) | ORAL | Status: DC
  Administered 2020-02-14 – 2020-02-17 (×6): 300 mg via ORAL
  Filled 2020-02-14 (×8): qty 1

## 2020-02-14 MED ORDER — HYDROXYZINE HCL 25 MG PO TABS
25.0000 mg | ORAL_TABLET | Freq: Four times a day (QID) | ORAL | Status: DC | PRN
  Administered 2020-02-14 – 2020-02-17 (×4): 25 mg via ORAL
  Filled 2020-02-14 (×4): qty 1

## 2020-02-14 MED ORDER — QUETIAPINE FUMARATE 25 MG PO TABS
100.0000 mg | ORAL_TABLET | Freq: Every day | ORAL | Status: DC
  Administered 2020-02-14 – 2020-02-16 (×3): 100 mg via ORAL
  Filled 2020-02-14 (×4): qty 4

## 2020-02-14 MED ORDER — HYDROXYZINE PAMOATE 25 MG PO CAPS
25.0000 mg | ORAL_CAPSULE | Freq: Four times a day (QID) | ORAL | Status: DC | PRN
  Filled 2020-02-14 (×2): qty 1

## 2020-02-14 MED ORDER — LAMOTRIGINE 100 MG PO TABS
200.0000 mg | ORAL_TABLET | Freq: Every day | ORAL | Status: DC
  Administered 2020-02-15 – 2020-02-17 (×3): 200 mg via ORAL
  Filled 2020-02-14 (×3): qty 2

## 2020-02-14 NOTE — ED Triage Notes (Signed)
[name] with reports of feeling "like I have PTSD".  Pt denies diagnosis of PTSD but reports feeling anxious, clammy, "like I am having a nervous breakdown, after a nervous breakdown, after a nervous breakdown."  Husband reports that pt has stopped taking her Lamictal.

## 2020-02-14 NOTE — Telephone Encounter (Signed)
Reviewed.  No call b/c caller didn't leave #

## 2020-02-14 NOTE — ED Notes (Signed)
Patient came to ED with husband and states she is feeling like she is having a nervous breakdown. When Clinical research associate and EDT was in room with patient she randomly started talking about having to do another scrapbook, about her baby. Patient then began talking about time, politics, being admitted to [facility]. Patient feels she is having a nervous breakdown
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
## 2020-02-14 NOTE — ED Provider Notes (Signed)
[facility] Emergency Department Provider Note   ____________________________________________   I have reviewed the triage vital signs and the nursing notes.   HISTORY  Chief Complaint Psychiatric Evaluation   History limited by and level 5 caveat due to: Psychosis   HPI Angela Robinson is a 29 y.o. female who presents to the emergency department today by family because of concerns for altered mental status.  Patient does have a history of bipolar disease.  Patient is unable to give any significant history as to why she is here in the emergency department.  She is unaware that she is here for psychiatric concern.  She denies any medical complaints. Per husbands report the patient has stopped taking her Lamictal.    Records reviewed. Per medical record review patient has a history of bipolar.   Past Medical History:  Diagnosis Date  . Bipolar disorder [facility]) 2010    Patient Active Problem List   Diagnosis Date Noted  . Hypothyroid 05/09/2012  . History of PID 11/17/2011    Past Surgical History:  Procedure Laterality Date  . WISDOM TOOTH EXTRACTION      Prior to Admission medications   Medication Sig Start Date End Date Taking? Authorizing Provider  hydrOXYzine (VISTARIL) 25 MG capsule Take 1 capsule (25 mg total) by mouth every 6 (six) hours as needed. 01/23/20   Mozingo, Thereasa Solo, NP  lamoTRIgine (LAMICTAL) 200 MG tablet Take 1 tablet (200 mg total) by mouth daily. 01/23/20   Mozingo, Thereasa Solo, NP  norgestimate-ethinyl estradiol (ORTHO-CYCLEN) 0.25-35 MG-MCG tablet Take 1 tablet by mouth daily. 01/30/20   Olivia Mackie, NP  Oxcarbazepine (TRILEPTAL) 300 MG tablet Take 1 tablet (300 mg total) by mouth 2 (two) times daily. 01/23/20   Mozingo, Thereasa Solo, NP  QUEtiapine (SEROQUEL) 100 MG tablet Take 1 tablet (100 mg total) by mouth at bedtime. 01/23/20   Mozingo, Thereasa Solo, NP    Allergies Patient has no known  allergies.  Family History  Problem Relation Age of Onset  . Diabetes Maternal Grandmother   . Breast cancer Maternal Grandmother        Age60's  . Diabetes Maternal Grandfather   . Hyperlipidemia Paternal Grandmother   . Heart disease Paternal Grandmother   . Hyperlipidemia Paternal Grandfather   . Heart disease Paternal Grandfather   . Kidney failure Paternal Grandfather     Social History Social History   Tobacco Use  . Smoking status: Never Smoker  . Smokeless tobacco: Never Used  Vaping Use  . Vaping Use: Never used  Substance Use Topics  . Alcohol use: Yes    Alcohol/week: 0.0 standard drinks    Comment: RARELY  . Drug use: No    Review of Systems Unable to obtain secondary to ams ____________________________________________   PHYSICAL EXAM:  VITAL SIGNS: ED Triage Vitals [02/14/20 1717]  Enc Vitals Group     BP 121/86     Pulse Rate (!) 124     Resp 18     Temp 98.2 F (36.8 C)     Temp Source Oral     SpO2 100 %     Weight 111 lb (50.3 kg)     Height 5\' 3"  (1.6 m)     Head Circumference      Peak Flow      Pain Score 0   Constitutional: Awake and alert.  Eyes: Conjunctivae are normal.  ENT      Head: Normocephalic and atraumatic.  Nose: No congestion/rhinnorhea.      Mouth/Throat: Mucous membranes are moist.      Neck: No stridor. Hematological/Lymphatic/Immunilogical: No cervical lymphadenopathy. Cardiovascular: Normal rate, regular rhythm.  No murmurs, rubs, or gallops.  Respiratory: Normal respiratory effort without tachypnea nor retractions. Breath sounds are clear and equal bilaterally. No wheezes/rales/rhonchi. Gastrointestinal: Soft and non tender. No rebound. No guarding.  Genitourinary: Deferred Musculoskeletal: Normal range of motion in all extremities. No lower extremity edema. Neurologic:  Normal speech and language. No gross focal neurologic deficits are appreciated.  Skin:  Skin is warm, dry and intact. No rash  noted. Psychiatric: Somewhat pressured speech. Poor insight. Appears manic.   ____________________________________________    LABS (pertinent positives/negatives)  Ethanol <10 CBC wbc 9.4, hgb 15.3, plt 332 CMP wnl except glu 114, t pro 8.7  ____________________________________________   EKG  None  ____________________________________________    RADIOLOGY  None  ____________________________________________   PROCEDURES  Procedures  ____________________________________________   INITIAL IMPRESSION / ASSESSMENT AND PLAN / ED COURSE  Pertinent labs & imaging results that were available during my care of the patient were reviewed by me and considered in my medical decision making (see chart for details).   Patient presented to the emergency department today because of concerns for altered mental status.  On exam patient does appear somewhat psychotic and manic.  Will have psychiatric team evaluate.  The patient has been placed in psychiatric observation due to the need to provide a safe environment for the patient while obtaining psychiatric consultation and evaluation, as well as ongoing medical and medication management to treat the patient's condition.  The patient has been placed under full IVC at this time.  ____________________________________________   FINAL CLINICAL IMPRESSION(S) / ED DIAGNOSES  Final diagnoses:  Psychosis, unspecified psychosis type (HCC)     Note: This dictation was prepared with Dragon dictation. Any transcriptional errors that result from this process are unintentional     Phineas Semen, MD 02/14/20 2352

## 2020-02-14 NOTE — Telephone Encounter (Signed)
Caryl Asp called to say the she is Angela Robinson's relative. She knows that Kambra sees Rosey Bath and Zierra is having a breakdown. She is asking for help for [place]en, she is not sure what to do or where to take her. If Rosey Bath could call her that would be great. [name] did not leave a #. It looks like pt is in ER at the moment.
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
## 2020-02-14 NOTE — BH Assessment (Signed)
Patient unable to be accepted to [facility] BMU at this time due to high acuity.  Staffed patient with Boykin Nearing who reports there are no appropriate beds available at this time at [facility].

## 2020-02-14 NOTE — ED Notes (Signed)
Patient walking around day room asking to see husband. Writer explained she can see him tomorrow during visitation. Patient talking to self and pacing.

## 2020-02-14 NOTE — ED Notes (Signed)

## 2020-02-14 NOTE — ED Notes (Signed)
Pt belongings include Black boots, white sweatshirt, white T-shirt, black socks, black backpack, white underwear, and gray sport bar.

## 2020-02-15 LAB — URINE DRUG SCREEN, QUALITATIVE (ARMC ONLY)
Amphetamines, Ur Screen: NOT DETECTED
Barbiturates, Ur Screen: NOT DETECTED
Benzodiazepine, Ur Scrn: NOT DETECTED
Cannabinoid 50 Ng, Ur ~~LOC~~: NOT DETECTED
Cocaine Metabolite,Ur ~~LOC~~: NOT DETECTED
MDMA (Ecstasy)Ur Screen: NOT DETECTED
Methadone Scn, Ur: NOT DETECTED
Opiate, Ur Screen: NOT DETECTED
Phencyclidine (PCP) Ur S: NOT DETECTED
Tricyclic, Ur Screen: POSITIVE — AB

## 2020-02-15 LAB — PREGNANCY, URINE: Preg Test, Ur: NEGATIVE

## 2020-02-15 MED ORDER — LORAZEPAM 2 MG PO TABS
2.0000 mg | ORAL_TABLET | Freq: Once | ORAL | Status: AC
  Administered 2020-02-15: 2 mg via ORAL
  Filled 2020-02-15: qty 1

## 2020-02-15 NOTE — ED Notes (Signed)
Pt walking around day room talking to self.

## 2020-02-15 NOTE — BH Assessment (Signed)
Writer spoke with Angela Robinson. Robinson advised TTS to continue to follow up as pt's are still under review at this time.   

## 2020-02-15 NOTE — ED Notes (Signed)
Pt talking loudly to self about pregnancy, glucose, [employer], [employer] and makeup. Pt states she needs something to help her sleep.

## 2020-02-15 NOTE — ED Notes (Addendum)
Pt given supplies to take a shower,  but came out  2 minutes later wearing the same scrubs.

## 2020-02-15 NOTE — ED Notes (Signed)
EDP made aware of pt's manic behavior. Received verbal order to administer Seroquel (early).

## 2020-02-15 NOTE — BH Assessment (Signed)
Writer contacted [facility] in regards to bed availability for patient, AC [facility] reports no current adult beds available and to contact back tomorrow 10/17 °

## 2020-02-15 NOTE — BH Assessment (Signed)
Referral checks:   1:09 PM This writer spoke with [facility] Hilda Lias about bed availability. Hilda Lias agreed to follow up on bed availability at a later time.

## 2020-02-15 NOTE — ED Notes (Signed)
Pt in bathroom brushing her teeth and states she will change her scrubs.

## 2020-02-15 NOTE — ED Notes (Signed)
Pt given lunch tray.

## 2020-02-15 NOTE — ED Notes (Signed)
Pt given dinner tray.

## 2020-02-15 NOTE — ED Notes (Signed)
IVC/pending psych consult 

## 2020-02-15 NOTE — BH Assessment (Signed)
Writer inquired about [facility] staffing/acuity for patient to be reviewed, Charge Nurse Britta Mccreedy reports staffing issues for tonight 10/16 as well as in the morning 10/17

## 2020-02-15 NOTE — ED Provider Notes (Signed)
Emergency Medicine Observation Re-evaluation Note  Angela Robinson is a 29 y.o. female, seen on rounds today.  Pt initially presented to the ED for complaints of Psychiatric Evaluation Currently, the patient is awake and sitting on her bed, denies complaints.  Physical Exam  BP (!) 122/92 (BP Location: Left Arm)   Pulse (!) 102   Temp 97.8 F (36.6 C) (Oral)   Resp (!) 22   Ht 5\' 3"  (1.6 m)   Wt 50.3 kg   LMP 01/26/2020   SpO2 100%   BMI 19.66 kg/m  Physical Exam Constitutional: Resting comfortably. Eyes: Conjunctivae are normal. Head: Atraumatic. Nose: No congestion/rhinnorhea. Mouth/Throat: Mucous membranes are moist. Neck: Normal ROM Cardiovascular: No cyanosis noted, breathing comfortably. Respiratory: Normal respiratory effort. Gastrointestinal: Non-distended. Genitourinary: deferred Musculoskeletal: No lower extremity tenderness nor edema. Neurologic:  Normal speech and language. No gross focal neurologic deficits are appreciated. Skin:  Skin is warm, dry and intact. No rash noted.    ED Course / MDM  EKG:    I have reviewed the labs performed to date as well as medications administered while in observation.  Recent changes in the last 24 hours include none.  Plan  Current plan is for further psychiatric assessment and likely admission. Patient is under full IVC at this time.   01/28/2020, MD 02/15/20 [phone]

## 2020-02-15 NOTE — ED Notes (Signed)
Patient took PO medications. Patient continues to come out of room. This Clinical research associate redirects patient into room and advises patient that day room is closed and she has to stay in room unless she needs to use the restroom.

## 2020-02-15 NOTE — ED Notes (Signed)
Pt speaking to herself in her room.

## 2020-02-15 NOTE — ED Notes (Signed)
Pt pushing emergency button in room. Pt states she is scared of the dark. Pt informed that she may turn tv on if she would like. Security officer orienting pt to how to keep lights in room on. Pt informed not to press button unless there is an emergency and to try and not disturb other patients.

## 2020-02-16 MED ORDER — TRIHEXYPHENIDYL HCL 2 MG PO TABS
1.0000 mg | ORAL_TABLET | Freq: Two times a day (BID) | ORAL | Status: DC
  Administered 2020-02-16: 1 mg via ORAL
  Filled 2020-02-16 (×4): qty 1

## 2020-02-16 MED ORDER — THIOTHIXENE 2 MG PO CAPS
2.0000 mg | ORAL_CAPSULE | Freq: Two times a day (BID) | ORAL | Status: DC
  Administered 2020-02-16 (×2): 2 mg via ORAL
  Filled 2020-02-16 (×4): qty 1

## 2020-02-16 MED ORDER — DOCUSATE SODIUM 100 MG PO CAPS
100.0000 mg | ORAL_CAPSULE | Freq: Two times a day (BID) | ORAL | Status: DC
  Filled 2020-02-16 (×2): qty 1

## 2020-02-16 NOTE — BH Assessment (Addendum)
[facility] BMU continues to have staffing issues as of 10/17 at 8:16AM, TTS will continue to follow-up with staffing/acuity of unit   TTS attempted to make contact with [facility] at 8:19AM and received no answer. TTS will continue to follow up with [facility] regarding bed availability   

## 2020-02-16 NOTE — ED Notes (Signed)
Patient Is IVc pending placement

## 2020-02-16 NOTE — ED Notes (Signed)
Pt. Alert and oriented, warm and dry, in no distress. Pt. Denies SI, HI, and AVH. Patient was tearful during assessment. Pt. Encouraged to let nursing staff know of any concerns or needs.   ENVIRONMENTAL ASSESSMENT Potentially harmful objects out of patient reach: Yes.   Personal belongings secured: Yes.   Patient dressed in hospital provided attire only: Yes.   Plastic bags out of patient reach: Yes.   Patient care equipment (cords, cables, call bells, lines, and drains) shortened, removed, or accounted for: Yes.   Equipment and supplies removed from bottom of stretcher: Yes.   Potentially toxic materials out of patient reach: Yes.   Sharps container removed or out of patient reach: Yes.

## 2020-02-16 NOTE — ED Notes (Signed)
Dinner tray given. No other needs found at this moment.  ?

## 2020-02-16 NOTE — BH Assessment (Addendum)
[facility] reports no current appropriate beds available at this time. TTS to follow up  10/16 8:24pm Previous Note- Writer inquired about [facility] staffing/acuity for patient to be reviewed, Charge Nurse Britta Mccreedy reports staffing issues for tonight 10/16 as well as in the morning 10/17

## 2020-02-16 NOTE — BH Assessment (Signed)
Patient currently under review with [facility] as of 4:35am 

## 2020-02-16 NOTE — ED Provider Notes (Signed)
Emergency Medicine Observation Re-evaluation Note  Angela Robinson is a 29 y.o. female, seen on rounds today.  Pt initially presented to the ED for complaints of Psychiatric Evaluation Currently, the patient is resting.  Physical Exam  BP 112/81   Pulse 100   Temp 98.8 F (37.1 C) (Oral)   Resp 18   Ht 5\' 3"  (1.6 m)   Wt 50.3 kg   LMP 01/26/2020   SpO2 98%   BMI 19.66 kg/m  Physical Exam General: nontoxic Cardiac: well perfused Lungs: even and unlabored resp Psych: calm  ED Course / MDM  EKG:    I have reviewed the labs performed to date as well as medications administered while in observation.  Recent changes in the last 24 hours include none.  Plan  Current plan is for psych dispo. Patient is under full IVC at this time.   01/28/2020, MD 02/16/20 [phone]

## 2020-02-16 NOTE — ED Notes (Signed)
Report to amy, rn

## 2020-02-16 NOTE — BH Assessment (Signed)
TTS completed reassessment. Pt presents calm, tangential but cooperative and oriented x 3. Pt reports to feel "less calm" than yesterday. After further assessment Pt began ranting about feeling fearful, claustrophobic, the need to check her weight, the need for dental floss and her favorite TV shows. Pt denies any current SI/HI/AH/VH however, due to pt's current presentation pt appears to be midly psychotic.   Per Dr. Smith Robert pt continues to meet criteria for INPT

## 2020-02-16 NOTE — ED Notes (Signed)
Breakfast tray given. No other needs found at this moment.  

## 2020-02-16 NOTE — ED Notes (Addendum)
Pt given a sandwich tray, ginger ale, and an ice cream cup. Pt crying in room.

## 2020-02-16 NOTE — BH Assessment (Addendum)
[facility] continues to have staffing issues as of 10/17 at 1pm, TTS will continue to follow-up with staffing/acuity of unit.  TTS attempted to make contact with [facility] ([phone] & [phone]) at 1:30pm and received no answer. TTS is currently unaware of bed availability at this time.   Referral information for Psychiatric Hospitalization faxed to:             [facility] ([phone]-or- [phone]),              [facility] ([phone]),              [facility] ([phone] -or- [phone]),              [facility] [phone] -or- [phone])           [phone]             [facility] ([phone] or [phone])             [facility] ([phone] or [phone])             [facility] ([phone] or [phone]),       

## 2020-02-16 NOTE — ED Notes (Addendum)
Patient Contacts  [name] [phone] Mom / Dad  [phone]

## 2020-02-16 NOTE — ED Notes (Signed)
Vital signs checked. Shower offered.  

## 2020-02-16 NOTE — Progress Notes (Signed)
Patient ID: Angela Robinson, female   DOB: April 15, 1991, 29 y.o.   MRN: [id number]    Rama Candise Bowens MD Very brief entry  Remains on IVC pending bed transfer Remains illogical disorganized strange haggard and paranoid   Not making sense  Looks depressed as well  Home meds were restarted  But Navane 2 bid along with artane also started  Monotherapy failure in past  And needs more dopamine med as strategy to help with psychosis  Seroquel alone not enough   No other side effects or medical   Has no insight wants to go home and she is okay she says

## 2020-02-17 DIAGNOSIS — F319 Bipolar disorder, unspecified: Secondary | ICD-10-CM

## 2020-02-17 MED ORDER — OXCARBAZEPINE 300 MG PO TABS: 300.0000 mg | ORAL_TABLET | Freq: Two times a day (BID) | ORAL | 1 refills | Status: DC

## 2020-02-17 MED ORDER — HYDROXYZINE PAMOATE 25 MG PO CAPS: 25.0000 mg | ORAL_CAPSULE | Freq: Four times a day (QID) | ORAL | 1 refills | Status: DC | PRN

## 2020-02-17 MED ORDER — LAMOTRIGINE 100 MG PO TABS: 100.0000 mg | ORAL_TABLET | Freq: Every day | ORAL | 1 refills | Status: DC

## 2020-02-17 MED ORDER — LAMOTRIGINE 100 MG PO TABS: 100.0000 mg | ORAL_TABLET | Freq: Every day | ORAL | Status: DC

## 2020-02-17 MED ORDER — QUETIAPINE FUMARATE 100 MG PO TABS: 100.0000 mg | ORAL_TABLET | Freq: Every day | ORAL | 1 refills | Status: DC

## 2020-02-17 NOTE — Consult Note (Signed)
[facility] Face-to-Face Psychiatry Consult   Reason for Consult: Consult for this 29 year old woman with a history of bipolar disorder who is been waiting in the emergency room for about 3 days now for treatment of bipolar disorder Referring Physician: Zack Patient Identification: Angela Robinson MRN:  [id number] Principal Diagnosis: Bipolar disorder depressed Diagnosis:  Principal Problem:   Bipolar 1 disorder, depressed (HCC)   Total Time spent with patient: 1 hour  Subjective:   Angela Robinson is a 29 y.o. female patient admitted with "I just needed to get my medicine straight".  HPI: 29 year old woman with a history of bipolar disorder presented to the emergency room on the 15th.  At the time there was a description of confused agitated atypical behavior.  Husband reported patient had not been staying at home and had not been taking care of her responsibilities as a mother and had been appearing to be confused.  Patient has a history of bipolar disorder and has had hospitalizations at [facility] recently.  On interview today the patient is alert and oriented and tells me that she just needed to get her medicine straight.  She says her mood is feeling better today.  She still has some tearfulness thinking about her home situation but is able to redirect herself from that.  She absolutely denies any suicidal or homicidal thought.  Denies any hallucinations or acute psychotic symptoms.  States a clear responsible plan to continue current psychiatric medicines at discharge.  Denies substance abuse.  Past Psychiatric History: Patient has a history of bipolar disorder and has had a recent hospitalization at [facility].  She reports that she has follow-up treatment through [facility] in [facility]  Risk to Self: Suicidal Ideation: No Suicidal Intent: No Is patient at risk for suicide?: No Suicidal Plan?: No Access to Means: No What has been your use of drugs/alcohol within the last 12  months?: Patient denies any substance use How many times?: 0 Other Self Harm Risks: None Triggers for Past Attempts: None known Intentional Self Injurious Behavior: None Risk to Others: Homicidal Ideation: No Thoughts of Harm to Others: No Current Homicidal Intent: No Current Homicidal Plan: No Access to Homicidal Means: No Identified Victim: None History of harm to others?: No Assessment of Violence: None Noted Violent Behavior Description: None Does patient have access to weapons?: No Criminal Charges Pending?: No Does patient have a court date: No Prior Inpatient Therapy: Prior Inpatient Therapy: Yes Prior Therapy Dates: 01/2020 Prior Therapy Facilty/Provider(s): [facility] Reason for Treatment: Bipolar Prior Outpatient Therapy: Prior Outpatient Therapy: Yes Prior Therapy Dates: Current Prior Therapy Facilty/Provider(s): [facility] Reason for Treatment: Bipolar disorder Does patient have an ACCT team?: No Does patient have Intensive In-House Services?  : No Does patient have [facility] services? : No Does patient have P4CC services?: No  Past Medical History:  Past Medical History:  Diagnosis Date  . Bipolar disorder (HCC) 2010    Past Surgical History:  Procedure Laterality Date  . WISDOM TOOTH EXTRACTION     Family History:  Family History  Problem Relation Age of Onset  . Diabetes Maternal Grandmother   . Breast cancer Maternal Grandmother        Age60's  . Diabetes Maternal Grandfather   . Hyperlipidemia Paternal Grandmother   . Heart disease Paternal Grandmother   . Hyperlipidemia Paternal Grandfather   . Heart disease Paternal Grandfather   . Kidney failure Paternal Grandfather    Family Psychiatric  History: It sounds like there is  some family history of bipolar disorder as well Social History:  Social History   Substance and Sexual Activity  Alcohol Use Yes  . Alcohol/week: 0.0 standard drinks   Comment: RARELY     Social History   Substance and  Sexual Activity  Drug Use No    Social History   Socioeconomic History  . Marital status: Married    Spouse name: Not on file  . Number of children: Not on file  . Years of education: Not on file  . Highest education level: Not on file  Occupational History  . Not on file  Tobacco Use  . Smoking status: Never Smoker  . Smokeless tobacco: Never Used  Vaping Use  . Vaping Use: Never used  Substance and Sexual Activity  . Alcohol use: Yes    Alcohol/week: 0.0 standard drinks    Comment: RARELY  . Drug use: No  . Sexual activity: Yes    Birth control/protection: None    Comment: intercourse age 29, declined sexual partners question  Other Topics Concern  . Not on file  Social History Narrative  . Not on file   Social Determinants of Health   Financial Resource Strain:   . Difficulty of Paying Living Expenses: Not on file  Food Insecurity:   . Worried About Programme researcher, broadcasting/film/videounning Out of Food in the Last Year: Not on file  . Ran Out of Food in the Last Year: Not on file  Transportation Needs:   . Lack of Transportation (Medical): Not on file  . Lack of Transportation (Non-Medical): Not on file  Physical Activity:   . Days of Exercise per Week: Not on file  . Minutes of Exercise per Session: Not on file  Stress:   . Feeling of Stress : Not on file  Social Connections:   . Frequency of Communication with Friends and Family: Not on file  . Frequency of Social Gatherings with Friends and Family: Not on file  . Attends Religious Services: Not on file  . Active Member of Clubs or Organizations: Not on file  . Attends BankerClub or Organization Meetings: Not on file  . Marital Status: Not on file   Additional Social History:    Allergies:  No Known Allergies  Labs: No results found for this or any previous visit (from the past 48 hour(s)).  Current Facility-Administered Medications  Medication Dose Route Frequency Provider Last Rate Last Admin  . docusate sodium (COLACE) capsule 100 mg   100 mg Oral BID Dionne BucySiadecki, Sebastian, MD      . hydrOXYzine (ATARAX/VISTARIL) tablet 25 mg  25 mg Oral Q6H PRN Valrie HartHall, Scott A, RPH   25 mg at 02/17/20 1018  . [START ON 02/18/2020] lamoTRIgine (LAMICTAL) tablet 100 mg  100 mg Oral Daily Blessings Inglett T, MD      . Oxcarbazepine (TRILEPTAL) tablet 300 mg  300 mg Oral BID Loleta RoseForbach, Cory, MD   300 mg at 02/17/20 1017  . QUEtiapine (SEROQUEL) tablet 100 mg  100 mg Oral QHS Loleta RoseForbach, Cory, MD   100 mg at 02/16/20 2122   Current Outpatient Medications  Medication Sig Dispense Refill  . hydrOXYzine (VISTARIL) 25 MG capsule Take 1 capsule (25 mg total) by mouth every 6 (six) hours as needed. 60 capsule 1  . [START ON 02/18/2020] lamoTRIgine (LAMICTAL) 100 MG tablet Take 1 tablet (100 mg total) by mouth daily. 30 tablet 1  . norgestimate-ethinyl estradiol (ORTHO-CYCLEN) 0.25-35 MG-MCG tablet Take 1 tablet by mouth daily. 84 tablet 4  .  Oxcarbazepine (TRILEPTAL) 300 MG tablet Take 1 tablet (300 mg total) by mouth 2 (two) times daily. 60 tablet 1  . QUEtiapine (SEROQUEL) 100 MG tablet Take 1 tablet (100 mg total) by mouth at bedtime. 30 tablet 1    Musculoskeletal: Strength & Muscle Tone: within normal limits Gait & Station: normal Patient leans: N/A  Psychiatric Specialty Exam: Physical Exam Vitals and nursing note reviewed.  Constitutional:      Appearance: She is well-developed.  HENT:     Head: Normocephalic and atraumatic.  Eyes:     Conjunctiva/sclera: Conjunctivae normal.     Pupils: Pupils are equal, round, and reactive to light.  Cardiovascular:     Heart sounds: Normal heart sounds.  Pulmonary:     Effort: Pulmonary effort is normal.  Abdominal:     Palpations: Abdomen is soft.  Musculoskeletal:        General: Normal range of motion.     Cervical back: Normal range of motion.  Skin:    General: Skin is warm and dry.  Neurological:     General: No focal deficit present.     Mental Status: She is alert.  Psychiatric:        Mood  and Affect: Mood normal.     Review of Systems  Constitutional: Negative.   HENT: Negative.   Eyes: Negative.   Respiratory: Negative.   Cardiovascular: Negative.   Gastrointestinal: Negative.   Musculoskeletal: Negative.   Skin: Negative.   Neurological: Negative.   Psychiatric/Behavioral: Negative.     Blood pressure 112/77, pulse 94, temperature 98.1 F (36.7 C), temperature source Oral, resp. rate 15, height 5\' 3"  (1.6 m), weight 50.3 kg, last menstrual period 01/26/2020, SpO2 98 %.Body mass index is 19.66 kg/m.  General Appearance: Casual  Eye Contact:  Good  Speech:  Clear and Coherent  Volume:  Normal  Mood:  Euthymic  Affect:  Congruent  Thought Process:  Goal Directed  Orientation:  Full (Time, Place, and Person)  Thought Content:  Logical  Suicidal Thoughts:  No  Homicidal Thoughts:  No  Memory:  Immediate;   Fair Recent;   Fair Remote;   Fair  Judgement:  Fair  Insight:  Fair  Psychomotor Activity:  Normal  Concentration:  Concentration: Fair  Recall:  01/28/2020 of Knowledge:  Fair  Language:  Fair  Akathisia:  No  Handed:  Right  AIMS (if indicated):     Assets:  Desire for Improvement Housing Physical Health Resilience Social Support  ADL's:  Intact  Cognition:  WNL  Sleep:        Treatment Plan Summary: Daily contact with patient to assess and evaluate symptoms and progress in treatment, Medication management and Plan Patient reports that the combination of Trileptal Seroquel and lamotrigine had been helpful for when she had been compliant with it.  She tells me that she is not sure where her medicines are and needs to have her prescriptions continued.  She agrees to follow-up treatment through [facility].  At this point patient no longer meets commitment criteria.  She appears to understand her illness and be able to make reasonable decisions about her own treatment.  Reviewed medication and treatment plan with the patient as well as with the  treatment team in the emergency room.  Discontinue IVC.  Patient will be given prescriptions and discharged home.  Disposition: Patient does not meet criteria for psychiatric inpatient admission. Discussed crisis plan, support from social network, calling 911, coming to the  Emergency Department, and calling Suicide Hotline.  Mordecai Rasmussen, MD 02/17/2020 10:22 AM

## 2020-02-17 NOTE — BH Assessment (Addendum)
[facility] as of 10/17 at 8PM,TTSwill continueto follow-up with staffing/acuity of unit  [facility] and received no answer. TTS will continue to follow up with [facility] regarding bed availability.  Referral information for Psychiatric Hospitalization faxed to:  [facility] ([phone]- [phone]), Per Greig Castilla there are no appropriate beds at this time.   [facility] [phone]),  [facility] Braun agreed to call back; referrals yet to be reviewed.  [facility] [phone] -[phone]), No answer; left message for call back with [facility].   [facility] (-[phone] -or- [phone]) [phone] No intake staff until morning.  [facility] [phone] or [phone]) Per Joni Reining facility not accepting pts at this time.   [facility] [phone] or [phone]) No intake staff until morning  [facility] [phone] or [phone]),Geropsych pts only

## 2020-02-17 NOTE — BH Assessment (Signed)
[facility] Barbara advisedTTSto follow-up with bed availability in the morning due to staffing/acuity of unitand having a pt on 1:1.   TTS spoke with [facility]. Tosin advised TTS to follow up with [facility] regarding bed availability in the AM.  

## 2020-02-17 NOTE — ED Provider Notes (Signed)
Patient cleared for discharge by psychiatry.  Discharge stable condition.   Gilles Chiquito, MD 02/17/20 1049

## 2020-02-17 NOTE — BH Assessment (Signed)
TTS completed reassessment with provider. Pt presents calm, alert and oriented x 4. Pt reports to feel safe to discharge and discussed medications with the provider. Pt agreed to [facility] upon [facility]. Pt denies any current SI/HI/AH/VH and contracted for safety   Per Dr. Toni Amend pt will be discharged with medications and the recommendation to follow up with OPT resources provided

## 2020-02-17 NOTE — BH Assessment (Signed)
TTS was contacted by Mar[facility] ([facility]) reporting pt to be under review. 

## 2020-02-20 ENCOUNTER — Ambulatory Visit (INDEPENDENT_AMBULATORY_CARE_PROVIDER_SITE_OTHER): Payer: BC Managed Care – PPO | Admitting: Physician Assistant

## 2020-02-20 ENCOUNTER — Other Ambulatory Visit: Payer: Self-pay

## 2020-02-20 ENCOUNTER — Encounter: Payer: Self-pay | Admitting: Physician Assistant

## 2020-02-20 DIAGNOSIS — F4310 Post-traumatic stress disorder, unspecified: Secondary | ICD-10-CM

## 2020-02-20 DIAGNOSIS — F314 Bipolar disorder, current episode depressed, severe, without psychotic features: Secondary | ICD-10-CM

## 2020-02-20 DIAGNOSIS — F411 Generalized anxiety disorder: Secondary | ICD-10-CM

## 2020-02-20 DIAGNOSIS — F410 Panic disorder [episodic paroxysmal anxiety] without agoraphobia: Secondary | ICD-10-CM

## 2020-02-20 MED ORDER — LAMOTRIGINE 100 MG PO TABS: ORAL_TABLET | ORAL | 1 refills | Status: DC
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
## 2020-02-20 NOTE — Patient Instructions (Signed)
Increase the Lamictal 100 mg to 1 1/2 pills daily for 2 weeks and then increase to 1 pill twice a day.  Continue all the other meds the same.

## 2020-02-26 ENCOUNTER — Ambulatory Visit: Payer: BC Managed Care – PPO | Admitting: Physician Assistant

## 2020-03-10 ENCOUNTER — Other Ambulatory Visit: Payer: Self-pay | Admitting: Psychiatry

## 2020-03-24 ENCOUNTER — Encounter: Payer: Self-pay | Admitting: Physician Assistant

## 2020-03-24 ENCOUNTER — Ambulatory Visit (INDEPENDENT_AMBULATORY_CARE_PROVIDER_SITE_OTHER): Payer: Self-pay | Admitting: Physician Assistant

## 2020-03-24 ENCOUNTER — Other Ambulatory Visit: Payer: Self-pay | Admitting: Physician Assistant

## 2020-03-24 ENCOUNTER — Other Ambulatory Visit: Payer: Self-pay

## 2020-03-24 DIAGNOSIS — G4700 Insomnia, unspecified: Secondary | ICD-10-CM

## 2020-03-24 DIAGNOSIS — F319 Bipolar disorder, unspecified: Secondary | ICD-10-CM

## 2020-03-24 DIAGNOSIS — F4310 Post-traumatic stress disorder, unspecified: Secondary | ICD-10-CM

## 2020-03-24 DIAGNOSIS — F411 Generalized anxiety disorder: Secondary | ICD-10-CM

## 2020-03-24 MED ORDER — QUETIAPINE FUMARATE 100 MG PO TABS: 100.0000 mg | ORAL_TABLET | Freq: Every day | ORAL | 2 refills | Status: DC

## 2020-03-24 MED ORDER — LAMOTRIGINE 100 MG PO TABS
100.0000 mg | ORAL_TABLET | Freq: Every day | ORAL | 2 refills | Status: DC
Start: 2020-03-24 — End: 2020-05-15

## 2020-03-24 MED ORDER — OXCARBAZEPINE 300 MG PO TABS: 300.0000 mg | ORAL_TABLET | Freq: Two times a day (BID) | ORAL | 2 refills | Status: DC
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
## 2020-04-07 ENCOUNTER — Other Ambulatory Visit: Payer: Self-pay | Admitting: Physician Assistant

## 2020-04-07 DIAGNOSIS — F319 Bipolar disorder, unspecified: Secondary | ICD-10-CM

## 2020-04-22 ENCOUNTER — Other Ambulatory Visit: Payer: Self-pay | Admitting: Psychiatry

## 2020-04-22 DIAGNOSIS — F319 Bipolar disorder, unspecified: Secondary | ICD-10-CM

## 2020-05-12 ENCOUNTER — Ambulatory Visit: Payer: Self-pay | Admitting: Physician Assistant

## 2020-05-14 ENCOUNTER — Telehealth: Payer: Self-pay | Admitting: Physician Assistant

## 2020-05-14 NOTE — Telephone Encounter (Signed)
Please call and let her know she has refills on the Lamictal.  The Seroquel was sent in 04/08/2020, 90-day supply.  She does not need refills now.  Reminded her that she needs to keep her appointments.  She missed 1 in the last few weeks and taking care of her prescription needs are a part of the reason for follow-up appointments.

## 2020-05-14 NOTE — Telephone Encounter (Signed)
Next appt is 06/30/20. Requesting refill on Seroquel and Lamotrigine. [id number]. Phone # is [phone].

## 2020-05-15 ENCOUNTER — Other Ambulatory Visit: Payer: Self-pay | Admitting: Physician Assistant

## 2020-05-15 DIAGNOSIS — F319 Bipolar disorder, unspecified: Secondary | ICD-10-CM

## 2020-05-15 MED ORDER — QUETIAPINE FUMARATE 100 MG PO TABS: ORAL_TABLET | ORAL | 0 refills | Status: DC

## 2020-05-15 MED ORDER — LAMOTRIGINE 100 MG PO TABS: 100.0000 mg | ORAL_TABLET | Freq: Every day | ORAL | 2 refills | Status: DC

## 2020-05-15 NOTE — Telephone Encounter (Signed)
Patient is requesting a different pharmacy then what her Rx was previously submitted to.

## 2020-05-15 NOTE — Telephone Encounter (Signed)
Prescriptions were sent

## 2020-06-02 ENCOUNTER — Other Ambulatory Visit: Payer: Self-pay | Admitting: Physician Assistant

## 2020-06-02 DIAGNOSIS — F319 Bipolar disorder, unspecified: Secondary | ICD-10-CM

## 2020-06-30 ENCOUNTER — Other Ambulatory Visit: Payer: Self-pay

## 2020-06-30 ENCOUNTER — Ambulatory Visit (INDEPENDENT_AMBULATORY_CARE_PROVIDER_SITE_OTHER): Payer: 59 | Admitting: Physician Assistant

## 2020-06-30 ENCOUNTER — Encounter: Payer: Self-pay | Admitting: Physician Assistant

## 2020-06-30 DIAGNOSIS — F411 Generalized anxiety disorder: Secondary | ICD-10-CM | POA: Diagnosis not present

## 2020-06-30 DIAGNOSIS — F410 Panic disorder [episodic paroxysmal anxiety] without agoraphobia: Secondary | ICD-10-CM | POA: Diagnosis not present

## 2020-06-30 DIAGNOSIS — F319 Bipolar disorder, unspecified: Secondary | ICD-10-CM

## 2020-06-30 DIAGNOSIS — G4700 Insomnia, unspecified: Secondary | ICD-10-CM

## 2020-06-30 DIAGNOSIS — Z79899 Other long term (current) drug therapy: Secondary | ICD-10-CM

## 2020-06-30 MED ORDER — HYDROXYZINE PAMOATE 25 MG PO CAPS: 25.0000 mg | ORAL_CAPSULE | Freq: Four times a day (QID) | ORAL | 3 refills | Status: DC | PRN

## 2020-06-30 MED ORDER — LAMOTRIGINE 100 MG PO TABS
100.0000 mg | ORAL_TABLET | Freq: Every day | ORAL | 3 refills | Status: DC
Start: 2020-06-30 — End: 2021-08-09

## 2020-06-30 MED ORDER — QUETIAPINE FUMARATE 100 MG PO TABS
ORAL_TABLET | ORAL | 3 refills | Status: DC
Start: 2020-06-30 — End: 2021-08-17

## 2020-06-30 NOTE — Progress Notes (Signed)
[facility]  Patient ID: Angela Robinson,  MRN: [id number]  PCP: Patient, No Pcp Per   Date of Evaluation: 06/30/2020 Time spent:30 minutes  Chief Complaint:  Chief Complaint    Anxiety; Depression; Insomnia; Follow-up      HISTORY/CURRENT STATUS: HPI  For routine med check.   Doing well. Now has joint custody of son. Not sure what will happen w/ marriage. Pt is working 40 hours/wk at [employer]. Still living with her aunt right now.   Able to enjoy things, energy and motivation are good.  Sleeps well, no nightmares. Feels rested when gets up. Takes hydroxyzine morning and night now. Not needing it more often. No PA. Not crying easily. No SI/HI.   Denies increased energy with decreased need for sleep.  No impulsivity, risky behavior, or grandiosity. No increased libido. She is not spending money that she does not have, no paranoia and no hallucinations.  Denies dizziness, syncope, seizures, numbness, tingling, tremor, tics, unsteady gait, slurred speech, confusion. Denies muscle or joint pain, stiffness, or dystonia.  Individual Medical History/ Review of Systems: Changes? :No    Past medications for mental health diagnoses include: Depakote, Lamictal, Melatonin  Allergies: Tylenol [acetaminophen]  Current Medications:  Current Outpatient Medications:  .  norgestimate-ethinyl estradiol (ORTHO-CYCLEN) 0.25-35 MG-MCG tablet, Take 1 tablet by mouth daily., Disp: 84 tablet, Rfl: 4 .  Oxcarbazepine (TRILEPTAL) 300 MG tablet, TAKE 1 TABLET BY MOUTH 2 TIMES DAILY., Disp: 60 tablet, Rfl: 2 .  hydrOXYzine (VISTARIL) 25 MG capsule, Take 1 capsule (25 mg total) by mouth every 6 (six) hours as needed., Disp: 180 capsule, Rfl: 3 .  lamoTRIgine (LAMICTAL) 100 MG tablet, Take 1 tablet (100 mg total) by mouth daily., Disp: 90 tablet, Rfl: 3 .  QUEtiapine (SEROQUEL) 100 MG tablet, TAKE 1 TABLET BY MOUTH EVERYDAY AT BEDTIME, Disp: 90 tablet, Rfl: 3 Medication Side Effects:  none  Family Medical/ Social History: Changes?  No.  She is still living with her aunt and uncle.  She and her husband are still separated.  See HPI.  MENTAL HEALTH EXAM:  There were no vitals taken for this visit.There is no height or weight on file to calculate BMI.  General Appearance: Casual, Neat and Well Groomed  Eye Contact:  Good  Speech:  Clear and Coherent and Normal Rate  Volume:  Normal  Mood:  Euthymic  Affect:  Appropriate  Thought Process:  Goal Directed and Descriptions of Associations: Intact  Orientation:  Full (Time, Place, and Person)  Thought Content: Logical   Suicidal Thoughts:  No  Homicidal Thoughts:  No  Memory:  WNL  Judgement:  Good  Insight:  Good  Psychomotor Activity:  Normal  Concentration:  Concentration: Good and Attention Span: Good  Recall:  Good  Fund of Knowledge: Good  Language: Good  Assets:  Desire for Improvement  ADL's:  Intact  Cognition: WNL  Prognosis:  Good   Most recent pertinent labs: 02/14/2020 Glucose 114, Na 138, potassium 3.9, BUN and creatinine 17/0.79, LFTs normal No recent lipid panel.    DIAGNOSES:    ICD-10-CM   1. Bipolar depression (HCC)  F31.9 QUEtiapine (SEROQUEL) 100 MG tablet  2. Insomnia, unspecified type  G47.00 hydrOXYzine (VISTARIL) 25 MG capsule  3. Generalized anxiety disorder  F41.1 hydrOXYzine (VISTARIL) 25 MG capsule  4. Panic attacks  F41.0 hydrOXYzine (VISTARIL) 25 MG capsule  5. Encounter for long-term (current) use of medications  Z79.899 Comprehensive metabolic panel    Lipid panel  Lamotrigine level    10-Hydroxycarbazepine    Receiving Psychotherapy: No    RECOMMENDATIONS:  PDMP reviewed. I provided 30 mins of face to face time during this visit in which we discussed need for labs, and her response to meds. No need to change meds/doses now as she is doing well, despite all that's going on.  Continue Lamictal 100 mg p.o. daily.   Continue hydroxyzine 25 mg, 1 p.o. every 6 hours as  needed anxiety. Continue Trileptal 300 mg, 1 p.o. twice daily. Continue Seroquel 100 mg, 1 p.o. nightly. Labs ordered to be in April, fasting. Return in 2 months.  Melony Overly, PA-C

## 2020-08-20 ENCOUNTER — Ambulatory Visit (INDEPENDENT_AMBULATORY_CARE_PROVIDER_SITE_OTHER): Payer: 59 | Admitting: Nurse Practitioner

## 2020-08-20 ENCOUNTER — Other Ambulatory Visit: Payer: Self-pay

## 2020-08-20 ENCOUNTER — Encounter: Payer: Self-pay | Admitting: Nurse Practitioner

## 2020-08-20 VITALS — BP 112/66

## 2020-08-20 DIAGNOSIS — B373 Candidiasis of vulva and vagina: Secondary | ICD-10-CM

## 2020-08-20 DIAGNOSIS — N898 Other specified noninflammatory disorders of vagina: Secondary | ICD-10-CM

## 2020-08-20 DIAGNOSIS — B3731 Acute candidiasis of vulva and vagina: Secondary | ICD-10-CM

## 2020-08-20 LAB — WET PREP FOR TRICH, YEAST, CLUE

## 2020-08-20 MED ORDER — FLUCONAZOLE 150 MG PO TABS
150.0000 mg | ORAL_TABLET | ORAL | 0 refills | Status: DC
Start: 2020-08-20 — End: 2020-10-27

## 2020-08-20 NOTE — Progress Notes (Signed)
   Acute Office Visit  Subjective:    Patient ID: Angela Robinson, female    DOB: [birth year], 30 y.o.   MRN: [id number]   HPI 30 y.o. presents today for vaginal itching that comes and goes without discharge or odor. She feels it starts just before her menses and gets worse after. Itching and redness are mostly on outside.   Review of Systems  Constitutional: Negative.   Genitourinary: Positive for vaginal pain. Negative for vaginal discharge.       Vaginal itching and redness       Objective:    Physical Exam Constitutional:      Appearance: Normal appearance.  Genitourinary:    Vagina: Vaginal discharge present. No erythema.       Comments: Vulvar redness    BP 112/66   LMP 08/09/2020  Wt Readings from Last 3 Encounters:  02/14/20 111 lb (50.3 kg)  01/30/20 111 lb (50.3 kg)  07/25/17 130 lb (59 kg)        Assessment & Plan:   Problem List Items Addressed This Visit   None   Visit Diagnoses    Vulvovaginal candidiasis    -  Primary   Relevant Medications   fluconazole (DIFLUCAN) 150 MG tablet   Vagina itching       Relevant Orders   WET PREP FOR TRICH, YEAST, CLUE     Plan: Wet prep positive for yeast. Diflucan 150 today and repeat in 3 days if symptoms persist for total of 2 doses. She is agreeable to plan.      Olivia Mackie DNP, 9:03 AM 08/20/2020

## 2020-09-01 ENCOUNTER — Other Ambulatory Visit: Payer: Self-pay

## 2020-09-01 ENCOUNTER — Ambulatory Visit: Payer: 59 | Admitting: Physician Assistant

## 2020-10-27 ENCOUNTER — Ambulatory Visit (INDEPENDENT_AMBULATORY_CARE_PROVIDER_SITE_OTHER): Payer: 59 | Admitting: Physician Assistant

## 2020-10-27 ENCOUNTER — Encounter: Payer: Self-pay | Admitting: Physician Assistant

## 2020-10-27 ENCOUNTER — Other Ambulatory Visit: Payer: Self-pay

## 2020-10-27 DIAGNOSIS — F319 Bipolar disorder, unspecified: Secondary | ICD-10-CM | POA: Diagnosis not present

## 2020-10-27 DIAGNOSIS — G4700 Insomnia, unspecified: Secondary | ICD-10-CM

## 2020-10-27 DIAGNOSIS — F4310 Post-traumatic stress disorder, unspecified: Secondary | ICD-10-CM

## 2020-10-27 DIAGNOSIS — F411 Generalized anxiety disorder: Secondary | ICD-10-CM | POA: Diagnosis not present

## 2020-10-27 DIAGNOSIS — Z3009 Encounter for other general counseling and advice on contraception: Secondary | ICD-10-CM

## 2020-10-27 MED ORDER — OXCARBAZEPINE 300 MG PO TABS: 300.0000 mg | ORAL_TABLET | Freq: Two times a day (BID) | ORAL | 0 refills | Status: DC

## 2020-10-27 NOTE — Progress Notes (Signed)
[facility]  Patient ID: Angela Robinson,  MRN: [id number]  PCP: Patient, No Pcp Per (Inactive)   Date of Evaluation: 10/27/2020  time spent:40 minutes  Chief Complaint:  Chief Complaint   Anxiety; Depression; Follow-up      HISTORY/CURRENT STATUS: HPI  For routine med check.   Doing well. Is back with her husband and they're working things out. Feels that psych meds are where they need to be. Went off BCP. Husband is in [place] working for the past 6 weeks so hasn't had sex since then. Not trying to get pregnant though.  She left her job yesterday and is moving to [place] tomorrow.  Patient denies loss of interest in usual activities and is able to enjoy things.  Denies decreased energy or motivation.  Appetite has not changed.  No extreme sadness, tearfulness, or feelings of hopelessness.  Denies any changes in concentration, making decisions or remembering things.  Denies suicidal or homicidal thoughts.  Patient denies increased energy with decreased need for sleep, no increased talkativeness, no racing thoughts, no impulsivity or risky behaviors, no increased spending, no increased libido, no grandiosity, no increased irritability or anger, and no hallucinations.  Anxiety is well controlled.  She is not as stressed anyway because of her life situation now.  The hydroxyzine does help.  She sleeps well using the Seroquel and hydroxyzine.  Denies dizziness, syncope, seizures, numbness, tingling, tremor, tics, unsteady gait, slurred speech, confusion. Denies muscle or joint pain, stiffness, or dystonia.  Individual Medical History/ Review of Systems: Changes? :No    Past medications for mental health diagnoses include: Depakote, Lamictal, Melatonin  Allergies: Tylenol [acetaminophen]  Current Medications:  Current Outpatient Medications:    hydrOXYzine (VISTARIL) 25 MG capsule, Take 1 capsule (25 mg total) by mouth every 6 (six) hours as needed. (Patient taking  differently: Take 25 mg by mouth every 6 (six) hours as needed. She takes bid routinely.), Disp: 180 capsule, Rfl: 3   lamoTRIgine (LAMICTAL) 100 MG tablet, Take 1 tablet (100 mg total) by mouth daily., Disp: 90 tablet, Rfl: 3   QUEtiapine (SEROQUEL) 100 MG tablet, TAKE 1 TABLET BY MOUTH EVERYDAY AT BEDTIME, Disp: 90 tablet, Rfl: 3   norgestimate-ethinyl estradiol (ORTHO-CYCLEN) 0.25-35 MG-MCG tablet, Take 1 tablet by mouth daily. (Patient not taking: Reported on 10/27/2020), Disp: 84 tablet, Rfl: 4   Oxcarbazepine (TRILEPTAL) 300 MG tablet, Take 1 tablet (300 mg total) by mouth 2 (two) times daily., Disp: 180 tablet, Rfl: 0 Medication Side Effects: none  Family Medical/ Social History: Changes?  See HPI  MENTAL HEALTH EXAM:  There were no vitals taken for this visit.There is no height or weight on file to calculate BMI.  General Appearance: Casual, Neat and Well Groomed  Eye Contact:  Good  Speech:  Clear and Coherent and Normal Rate  Volume:  Normal  Mood:  Euthymic  Affect:  Appropriate  Thought Process:  Goal Directed and Descriptions of Associations: Intact  Orientation:  Full (Time, Place, and Person)  Thought Content: Logical   Suicidal Thoughts:  No  Homicidal Thoughts:  No  Memory:  WNL  Judgement:  Good  Insight:  Good  Psychomotor Activity:  Normal  Concentration:  Concentration: Good and Attention Span: Good  Recall:  Good  Fund of Knowledge: Good  Language: Good  Assets:  Desire for Improvement  ADL's:  Intact  Cognition: WNL  Prognosis:  Good   Most recent pertinent labs: 02/14/2020 Glucose 114, Na 138, potassium 3.9, BUN and  creatinine 17/0.79, LFTs normal No recent lipid panel.    She had labs drawn earlier this morning.  No results yet.  DIAGNOSES:    ICD-10-CM   1. Generalized anxiety disorder  F41.1     2. Bipolar depression (HCC)  F31.9 Oxcarbazepine (TRILEPTAL) 300 MG tablet    3. PTSD (post-traumatic stress disorder)  F43.10     4. Insomnia,  unspecified type  G47.00     5. Family planning advice  Z30.09        Receiving Psychotherapy: No    RECOMMENDATIONS:  PDMP reviewed. I provided 40 minutes of face to face time during this encounter, including time spent before and after the visit in records review, medical decision making, and charting.  I am glad she is doing better with her mood and circumstances have improved. We discussed her lack of birth control.  I stressed the importance of not getting pregnant with the medications that she is on.  I would want her to wean off Trileptal for sure, and probably Seroquel.  She states that she will have her husband use a condom every time.  She understands the risk of conceiving and possible birth defects due to several of her mental health medications.  She nor I want to wean her off of the Trileptal because she is doing so well and when we were trying to get that at an appropriate dose, she did not do well at all. Continue Lamictal 100 mg p.o. daily.   Continue hydroxyzine 25 mg, 1 p.o. every 6 hours as needed anxiety. Continue Trileptal 300 mg, 1 p.o. twice daily. Continue Seroquel 100 mg, 1 p.o. nightly. Return in 3 months.  Melony Overly, PA-C

## 2020-10-31 LAB — COMPREHENSIVE METABOLIC PANEL
ALT: 35 IU/L — ABNORMAL HIGH (ref 0–32)
AST: 32 IU/L (ref 0–40)
Albumin/Globulin Ratio: 1.8 (ref 1.2–2.2)
Albumin: 4.9 g/dL (ref 3.9–5.0)
Alkaline Phosphatase: 96 IU/L (ref 44–121)
BUN/Creatinine Ratio: 15 (ref 9–23)
BUN: 12 mg/dL (ref 6–20)
Bilirubin Total: 0.4 mg/dL (ref 0.0–1.2)
CO2: 22 mmol/L (ref 20–29)
Calcium: 9.5 mg/dL (ref 8.7–10.2)
Chloride: 102 mmol/L (ref 96–106)
Creatinine, Ser: 0.81 mg/dL (ref 0.57–1.00)
Globulin, Total: 2.8 g/dL (ref 1.5–4.5)
Glucose: 102 mg/dL — ABNORMAL HIGH (ref 65–99)
Potassium: 4.4 mmol/L (ref 3.5–5.2)
Sodium: 137 mmol/L (ref 134–144)
Total Protein: 7.7 g/dL (ref 6.0–8.5)
eGFR: 100 mL/min/{1.73_m2} (ref >59)

## 2020-10-31 LAB — 10-HYDROXYCARBAZEPINE: Oxcarbazepine SerPl-Mcnc: 16 ug/mL (ref 10–35)

## 2020-10-31 LAB — LIPID PANEL
Chol/HDL Ratio: 2.5 ratio (ref 0.0–4.4)
Cholesterol, Total: 216 mg/dL — ABNORMAL HIGH (ref 100–199)
HDL: 87 mg/dL (ref >39)
LDL Chol Calc (NIH): 119 mg/dL — ABNORMAL HIGH (ref 0–99)
Triglycerides: 59 mg/dL (ref 0–149)
VLDL Cholesterol Cal: 10 mg/dL (ref 5–40)

## 2020-10-31 LAB — LAMOTRIGINE LEVEL: Lamotrigine Lvl: 4.6 ug/mL (ref 2.0–20.0)

## 2020-11-03 ENCOUNTER — Telehealth: Payer: Self-pay | Admitting: Physician Assistant

## 2020-11-03 NOTE — Telephone Encounter (Signed)
Angela Robinson called to see if someone could call her regarding her lab results. Her phone number is [phone]. Her labs are showing in the system.

## 2020-11-03 NOTE — Telephone Encounter (Signed)
Informed pt Angela Robinson is out and we will have to discuss results when she returns

## 2020-11-10 NOTE — Progress Notes (Signed)
Labs are good except for slightly elevated cholesterol, nothing concerning. Liver and kidney functions are fine. Glu is nl. Lamictal and Trileptal levels are good. No change in treatment.

## 2020-11-10 NOTE — Telephone Encounter (Signed)
See comments on lab results.

## 2020-11-10 NOTE — Progress Notes (Signed)
Pt informed

## 2021-01-19 ENCOUNTER — Other Ambulatory Visit: Payer: Self-pay | Admitting: Physician Assistant

## 2021-01-19 DIAGNOSIS — G4700 Insomnia, unspecified: Secondary | ICD-10-CM

## 2021-01-19 DIAGNOSIS — F410 Panic disorder [episodic paroxysmal anxiety] without agoraphobia: Secondary | ICD-10-CM

## 2021-01-19 DIAGNOSIS — F411 Generalized anxiety disorder: Secondary | ICD-10-CM

## 2021-01-29 ENCOUNTER — Other Ambulatory Visit: Payer: Self-pay

## 2021-01-29 ENCOUNTER — Telehealth: Payer: 59 | Admitting: Physician Assistant

## 2021-01-29 ENCOUNTER — Telehealth: Payer: Self-pay | Admitting: Physician Assistant

## 2021-01-29 DIAGNOSIS — F319 Bipolar disorder, unspecified: Secondary | ICD-10-CM

## 2021-01-29 MED ORDER — OXCARBAZEPINE 300 MG PO TABS: 300.0000 mg | ORAL_TABLET | Freq: Two times a day (BID) | ORAL | 0 refills | Status: DC

## 2021-01-29 NOTE — Telephone Encounter (Signed)
Next appt is 03/12/21. Requesting refill for Trileptal  called to:  [id number] [address]  Phone:  [phone]  Fax:  [phone]   Edy is currently looking for another provider in [place].

## 2021-01-29 NOTE — Telephone Encounter (Signed)
Rx sent 

## 2021-03-04 ENCOUNTER — Telehealth: Payer: Self-pay | Admitting: Physician Assistant

## 2021-03-04 NOTE — Telephone Encounter (Signed)
Pt asked if Rosey Bath might be able to give her a referral for the [place] area since [place] used to live in the area.  Pls send it to her email  [email or web address]

## 2021-03-04 NOTE — Telephone Encounter (Signed)
Pt is requesting a psychiatric provider recommendation in [place].She will be moving soon

## 2021-03-06 ENCOUNTER — Other Ambulatory Visit: Payer: Self-pay | Admitting: Physician Assistant

## 2021-03-06 DIAGNOSIS — F410 Panic disorder [episodic paroxysmal anxiety] without agoraphobia: Secondary | ICD-10-CM

## 2021-03-06 DIAGNOSIS — G4700 Insomnia, unspecified: Secondary | ICD-10-CM

## 2021-03-06 DIAGNOSIS — F411 Generalized anxiety disorder: Secondary | ICD-10-CM

## 2021-03-07 NOTE — Telephone Encounter (Signed)
I'm sorry, but I don't know anyone. Have her check her providers that accept her insurance and go from there.

## 2021-03-08 NOTE — Telephone Encounter (Signed)
LVM with info

## 2021-03-12 ENCOUNTER — Ambulatory Visit: Payer: 59 | Admitting: Physician Assistant

## 2021-04-10 ENCOUNTER — Other Ambulatory Visit: Payer: Self-pay | Admitting: Physician Assistant

## 2021-04-10 DIAGNOSIS — F411 Generalized anxiety disorder: Secondary | ICD-10-CM

## 2021-04-10 DIAGNOSIS — F410 Panic disorder [episodic paroxysmal anxiety] without agoraphobia: Secondary | ICD-10-CM

## 2021-04-10 DIAGNOSIS — G4700 Insomnia, unspecified: Secondary | ICD-10-CM

## 2021-04-22 ENCOUNTER — Ambulatory Visit: Payer: 59 | Admitting: Physician Assistant

## 2021-04-26 ENCOUNTER — Other Ambulatory Visit: Payer: Self-pay | Admitting: Physician Assistant

## 2021-04-26 DIAGNOSIS — G4700 Insomnia, unspecified: Secondary | ICD-10-CM

## 2021-04-26 DIAGNOSIS — F411 Generalized anxiety disorder: Secondary | ICD-10-CM

## 2021-04-26 DIAGNOSIS — F410 Panic disorder [episodic paroxysmal anxiety] without agoraphobia: Secondary | ICD-10-CM

## 2021-05-01 ENCOUNTER — Other Ambulatory Visit: Payer: Self-pay | Admitting: Physician Assistant

## 2021-05-01 DIAGNOSIS — F319 Bipolar disorder, unspecified: Secondary | ICD-10-CM

## 2021-05-04 NOTE — Telephone Encounter (Signed)
LVM to verify pharmacy

## 2021-05-05 NOTE — Telephone Encounter (Signed)
Following up on the call made to patient yesterday. Again left VM. The refill request is showing a pharmacy in [place]. I wanted to verify pharmacy.

## 2021-05-24 ENCOUNTER — Encounter: Payer: Self-pay | Admitting: Physician Assistant

## 2021-05-24 ENCOUNTER — Ambulatory Visit (INDEPENDENT_AMBULATORY_CARE_PROVIDER_SITE_OTHER): Payer: 59 | Admitting: Physician Assistant

## 2021-05-24 ENCOUNTER — Other Ambulatory Visit: Payer: Self-pay

## 2021-05-24 DIAGNOSIS — F319 Bipolar disorder, unspecified: Secondary | ICD-10-CM

## 2021-05-24 DIAGNOSIS — F4310 Post-traumatic stress disorder, unspecified: Secondary | ICD-10-CM | POA: Diagnosis not present

## 2021-05-24 DIAGNOSIS — F411 Generalized anxiety disorder: Secondary | ICD-10-CM | POA: Diagnosis not present

## 2021-05-24 DIAGNOSIS — G4700 Insomnia, unspecified: Secondary | ICD-10-CM | POA: Diagnosis not present
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
## 2021-07-13 ENCOUNTER — Telehealth: Payer: Self-pay | Admitting: Physician Assistant

## 2021-07-13 NOTE — Telephone Encounter (Signed)
Please let her know I got the lab results from her GYN.  Her blood sugar, cholesterol panel and liver functions were all normal.  Lamictal level is within the normal range. ?Continue current doses of medications. ?Thank you. ?

## 2021-07-13 NOTE — Telephone Encounter (Signed)
LVM for patient with results.

## 2021-08-04 ENCOUNTER — Other Ambulatory Visit: Payer: Self-pay | Admitting: Physician Assistant

## 2021-08-04 DIAGNOSIS — F319 Bipolar disorder, unspecified: Secondary | ICD-10-CM

## 2021-08-06 ENCOUNTER — Other Ambulatory Visit: Payer: Self-pay | Admitting: Physician Assistant

## 2021-08-15 ENCOUNTER — Other Ambulatory Visit: Payer: Self-pay | Admitting: Physician Assistant

## 2021-08-15 DIAGNOSIS — F319 Bipolar disorder, unspecified: Secondary | ICD-10-CM

## 2021-08-16 NOTE — Telephone Encounter (Signed)
LVM to verify pharmacy

## 2021-08-17 NOTE — Telephone Encounter (Signed)
Left another VM 

## 2021-08-17 NOTE — Telephone Encounter (Signed)
Patient returned call and said this is the correct pharmacy.  ?

## 2021-11-05 ENCOUNTER — Other Ambulatory Visit: Payer: Self-pay | Admitting: Physician Assistant

## 2021-11-05 DIAGNOSIS — F319 Bipolar disorder, unspecified: Secondary | ICD-10-CM

## 2021-11-15 ENCOUNTER — Telehealth: Payer: Self-pay | Admitting: Physician Assistant

## 2021-11-15 NOTE — Telephone Encounter (Signed)
Have her start weaning off the Trileptal now. And still use BC until she's off this. It may cause birth defects.  Decrease Trileptal 300 mg, taking one half p.o. every morning and 1 p.o. nightly for 4 days, then one half p.o. twice daily for 4 days, then one half p.o. nightly for 4 days and then stop. For now continue everything else. Thank you.

## 2021-11-15 NOTE — Telephone Encounter (Signed)
Pt has an upcoming appt August 4. She wanted you to know that she is trying to get pregnant and wants you to know so you can look at medication options for her.

## 2021-11-16 NOTE — Telephone Encounter (Signed)
LVM with info and send message via my chart

## 2021-11-22 ENCOUNTER — Ambulatory Visit: Payer: 59 | Admitting: Physician Assistant

## 2021-11-30 ENCOUNTER — Other Ambulatory Visit: Payer: Self-pay | Admitting: Physician Assistant

## 2021-11-30 DIAGNOSIS — F319 Bipolar disorder, unspecified: Secondary | ICD-10-CM

## 2021-12-03 ENCOUNTER — Encounter: Payer: Self-pay | Admitting: Physician Assistant

## 2021-12-03 ENCOUNTER — Ambulatory Visit (INDEPENDENT_AMBULATORY_CARE_PROVIDER_SITE_OTHER): Payer: 59 | Admitting: Physician Assistant

## 2021-12-03 DIAGNOSIS — F411 Generalized anxiety disorder: Secondary | ICD-10-CM

## 2021-12-03 DIAGNOSIS — F410 Panic disorder [episodic paroxysmal anxiety] without agoraphobia: Secondary | ICD-10-CM | POA: Diagnosis not present

## 2021-12-03 DIAGNOSIS — F3170 Bipolar disorder, currently in remission, most recent episode unspecified: Secondary | ICD-10-CM

## 2021-12-03 DIAGNOSIS — G4700 Insomnia, unspecified: Secondary | ICD-10-CM

## 2021-12-03 DIAGNOSIS — Z3490 Encounter for supervision of normal pregnancy, unspecified, unspecified trimester: Secondary | ICD-10-CM

## 2021-12-03 MED ORDER — HYDROXYZINE PAMOATE 25 MG PO CAPS: 25.0000 mg | ORAL_CAPSULE | Freq: Four times a day (QID) | ORAL | 1 refills | Status: DC | PRN

## 2021-12-03 MED ORDER — QUETIAPINE FUMARATE 100 MG PO TABS: ORAL_TABLET | ORAL | 1 refills | Status: DC

## 2021-12-03 NOTE — Progress Notes (Unsigned)
[facility]  Patient ID: Angela Robinson,  MRN: [id number]  PCP: Patient, No Pcp Per   Date of Evaluation: 12/03/2021 time spent:30 minutes  Chief Complaint:  Chief Complaint   Follow-up    HISTORY/CURRENT STATUS: HPI  For routine med check.   Is pregnant. Maybe 6-7 wks, not sure. We already started weaning Trileptal 11/15/21 when she called saying she wanted to try to get pregnant. Has already spoken with her OB. "They went over my meds and didn't say anything about them. I was kind of shocked that they didn't want me to come off something." No complications with pregnancy so far. Is tired, that's all. She and husband still live in [place], [place]. Is here visiting family this weekend.   Patient is able to enjoy things.  Energy and motivation are good.  Work is going well.   No extreme sadness, tearfulness, or feelings of hopelessness.  Sleeps well most of the time. ADLs and personal hygiene are normal.   Denies any changes in concentration, making decisions, or remembering things.  Appetite has not changed.  Weight is stable.  Does have anxiety at times but not bad. Hydroxyzine helps.  Denies suicidal or homicidal thoughts.  Patient denies increased energy with decreased need for sleep, increased talkativeness, racing thoughts, impulsivity or risky behaviors, increased spending, increased libido, grandiosity, increased irritability or anger, paranoia, and no hallucinations.  Denies dizziness, syncope, seizures, numbness, tingling, tremor, tics, unsteady gait, slurred speech, confusion. Denies muscle or joint pain, stiffness, or dystonia. Denies unexplained weight loss, frequent infections, or sores that heal slowly.  No polyphagia, polydipsia, or polyuria. Denies visual changes or paresthesias.   Individual Medical History/ Review of Systems: Changes? :No    Past medications for mental health diagnoses include: Depakote, Lamictal, Melatonin  Allergies: Tylenol  [acetaminophen]  Current Medications:  Current Outpatient Medications:    lamoTRIgine (LAMICTAL) 100 MG tablet, TAKE 1 TABLET BY MOUTH EVERY DAY, Disp: 90 tablet, Rfl: 3   norgestimate-ethinyl estradiol (ORTHO-CYCLEN) 0.25-35 MG-MCG tablet, Take 1 tablet by mouth daily., Disp: 84 tablet, Rfl: 4   hydrOXYzine (VISTARIL) 25 MG capsule, Take 1 capsule (25 mg total) by mouth every 6 (six) hours as needed., Disp: 120 capsule, Rfl: 1   QUEtiapine (SEROQUEL) 100 MG tablet, 3/4 pill qhs for 1 week, then 1/2 po qhs, Disp: 90 tablet, Rfl: 1 Medication Side Effects: none  Family Medical/ Social History: Changes?  See HPI  MENTAL HEALTH EXAM:  There were no vitals taken for this visit.There is no height or weight on file to calculate BMI.  General Appearance: Casual, Neat and Well Groomed  Eye Contact:  Good  Speech:  Clear and Coherent and Normal Rate  Volume:  Normal  Mood:  Euthymic  Affect:  Appropriate  Thought Process:  Goal Directed and Descriptions of Associations: Circumstantial  Orientation:  Full (Time, Place, and Person)  Thought Content: Logical   Suicidal Thoughts:  No  Homicidal Thoughts:  No  Memory:  WNL  Judgement:  Good  Insight:  Good  Psychomotor Activity:  Normal  Concentration:  Concentration: Good and Attention Span: Good  Recall:  Good  Fund of Knowledge: Good  Language: Good  Assets:  Desire for Improvement Financial Resources/Insurance Housing Transportation Vocational/Educational  ADL's:  Intact  Cognition: WNL  Prognosis:  Good   Received labs from her GYN collected on 06/15/2021. CBC with differential numbers blurred on fax and couldn't read CMP glucose was 97, all other labs including kidney functions  and liver functions normal. Hemoglobin A1c 5.2  DIAGNOSES:    ICD-10-CM   1. Bipolar disorder in partial remission, most recent episode unspecified type (HCC)  F31.70 QUEtiapine (SEROQUEL) 100 MG tablet    2. Insomnia, unspecified type  G47.00  hydrOXYzine (VISTARIL) 25 MG capsule    3. Generalized anxiety disorder  F41.1 hydrOXYzine (VISTARIL) 25 MG capsule    4. Panic attacks  F41.0 hydrOXYzine (VISTARIL) 25 MG capsule    5. Pregnancy, unspecified gestational age  Z44.90       Receiving Psychotherapy: No   RECOMMENDATIONS:  PDMP reviewed.  No results available I provided 30 minutes of face to face time during this encounter, including time spent before and after the visit in records review, medical decision making, counseling pertinent to today's visit, and charting.   Congrats on her pregnancy!  Since she has already discussed her medications with her OB and they did not feel like there needed to be any changes, we will try to get her off the Seroquel, stop the Trileptal with her last day tomorrow.  But leave the Lamictal and hydroxyzine in place.  She understands that all medicines in pregnancy have to be weighed with the benefits to her in mind, plus the risk to her baby in mind.  She and I agree that the Lamictal and hydroxyzine are necessary right now and no change will be made.  Discontinue Trileptal. Continue hydroxyzine 25 mg, 1 p.o. every 6 hours as needed anxiety. Continue Lamictal 100 mg p.o. daily.   Continue Seroquel 100 mg, 3/4 pill qhs for 1 week, then one half p.o. nightly.  She will see the OB that next week to discuss whether she has to go off or not. Can call if any concerns with her meds.  Return in 6 months.  Melony Overly, PA-C

## 2021-12-04 ENCOUNTER — Encounter: Payer: Self-pay | Admitting: Physician Assistant

## 2021-12-21 ENCOUNTER — Other Ambulatory Visit: Payer: Self-pay | Admitting: Physician Assistant

## 2021-12-21 MED ORDER — QUETIAPINE FUMARATE 25 MG PO TABS: ORAL_TABLET | ORAL | 1 refills | Status: DC

## 2021-12-21 NOTE — Telephone Encounter (Signed)
Please see pt message

## 2022-01-02 ENCOUNTER — Other Ambulatory Visit: Payer: Self-pay | Admitting: Physician Assistant

## 2022-01-02 DIAGNOSIS — F411 Generalized anxiety disorder: Secondary | ICD-10-CM

## 2022-01-02 DIAGNOSIS — F319 Bipolar disorder, unspecified: Secondary | ICD-10-CM

## 2022-01-02 DIAGNOSIS — F410 Panic disorder [episodic paroxysmal anxiety] without agoraphobia: Secondary | ICD-10-CM

## 2022-01-02 DIAGNOSIS — G4700 Insomnia, unspecified: Secondary | ICD-10-CM

## 2022-01-13 ENCOUNTER — Telehealth: Payer: Self-pay | Admitting: Physician Assistant

## 2022-01-13 NOTE — Telephone Encounter (Signed)
Told her we needed a copy of the summons.

## 2022-01-13 NOTE — Telephone Encounter (Signed)
Pt Lvm @ 2:13p.  She wants to know if Rosey Bath can right her a note for jury duty.  Next appt 2/9

## 2022-01-17 ENCOUNTER — Other Ambulatory Visit: Payer: Self-pay | Admitting: Physician Assistant

## 2022-01-27 ENCOUNTER — Telehealth: Payer: Self-pay | Admitting: Physician Assistant

## 2022-01-27 NOTE — Telephone Encounter (Signed)
Pt LVM at 1:35p.  She said regarding jury duty, she still has [place] and is not registered in [place] (whatever that [place]).  She said she has about 1.5 wks to get the letter in to them.  Pls call her back.

## 2022-01-27 NOTE — Telephone Encounter (Signed)
Pt faxed the summons over to [place].She still needs a letter excusing her mainly because she does not live here.She has 1.5 weeks to get it mailed to them

## 2022-01-28 NOTE — Telephone Encounter (Signed)
I'm not sure where the jury summons for her is, but I know we had a copy of it.  At the time I did not think she needed an excuse from [place].  But since she has never changed her address, she will need the letter for jury duty excuse.  Her diagnosis is bipolar disorder and generalized anxiety disorder.

## 2022-02-01 ENCOUNTER — Other Ambulatory Visit: Payer: Self-pay | Admitting: Physician Assistant

## 2022-02-01 DIAGNOSIS — G4700 Insomnia, unspecified: Secondary | ICD-10-CM

## 2022-02-01 DIAGNOSIS — F411 Generalized anxiety disorder: Secondary | ICD-10-CM

## 2022-02-01 DIAGNOSIS — F410 Panic disorder [episodic paroxysmal anxiety] without agoraphobia: Secondary | ICD-10-CM

## 2022-02-02 ENCOUNTER — Telehealth: Payer: Self-pay | Admitting: Physician Assistant

## 2022-02-02 NOTE — Telephone Encounter (Signed)
I LM 01/31/22 with Angela Robinson to call about her jury summons.  I told her since she no longer lives in the county she is being asked to serve, she should qualify to be excused.  I told her to called to the court to request that excuse.  I also told her if she still needed a letter, we don't have a copy of the summons and would it to do the letter.

## 2022-02-15 ENCOUNTER — Other Ambulatory Visit: Payer: Self-pay | Admitting: Physician Assistant

## 2022-02-16 NOTE — Telephone Encounter (Signed)
LVM to RC. Is she still taking? 

## 2022-02-16 NOTE — Telephone Encounter (Signed)
Addendum to attached message. Angela Robinson just called and said that she is still taking her Seroquel and its still 25 mg.

## 2022-02-19 ENCOUNTER — Other Ambulatory Visit: Payer: Self-pay | Admitting: Physician Assistant

## 2022-02-19 DIAGNOSIS — F319 Bipolar disorder, unspecified: Secondary | ICD-10-CM

## 2022-05-27 ENCOUNTER — Other Ambulatory Visit: Payer: Self-pay | Admitting: Physician Assistant

## 2022-05-27 NOTE — Telephone Encounter (Signed)
Called to verify patient is still taking and she is.

## 2022-06-10 ENCOUNTER — Ambulatory Visit: Payer: 59 | Admitting: Physician Assistant

## 2022-06-12 ENCOUNTER — Other Ambulatory Visit: Payer: Self-pay | Admitting: Physician Assistant

## 2022-06-12 DIAGNOSIS — F410 Panic disorder [episodic paroxysmal anxiety] without agoraphobia: Secondary | ICD-10-CM

## 2022-06-12 DIAGNOSIS — F411 Generalized anxiety disorder: Secondary | ICD-10-CM

## 2022-06-12 DIAGNOSIS — G4700 Insomnia, unspecified: Secondary | ICD-10-CM

## 2022-06-17 ENCOUNTER — Other Ambulatory Visit: Payer: Self-pay | Admitting: Physician Assistant

## 2022-07-20 IMAGING — CR DG CHEST 2V
2 series · 2 of 2 positions shown · non-contrast
Comparison: None.

CLINICAL DATA: Chest pain.  Anxiety.

EXAM:
CHEST - 2 VIEW

[chest pa]
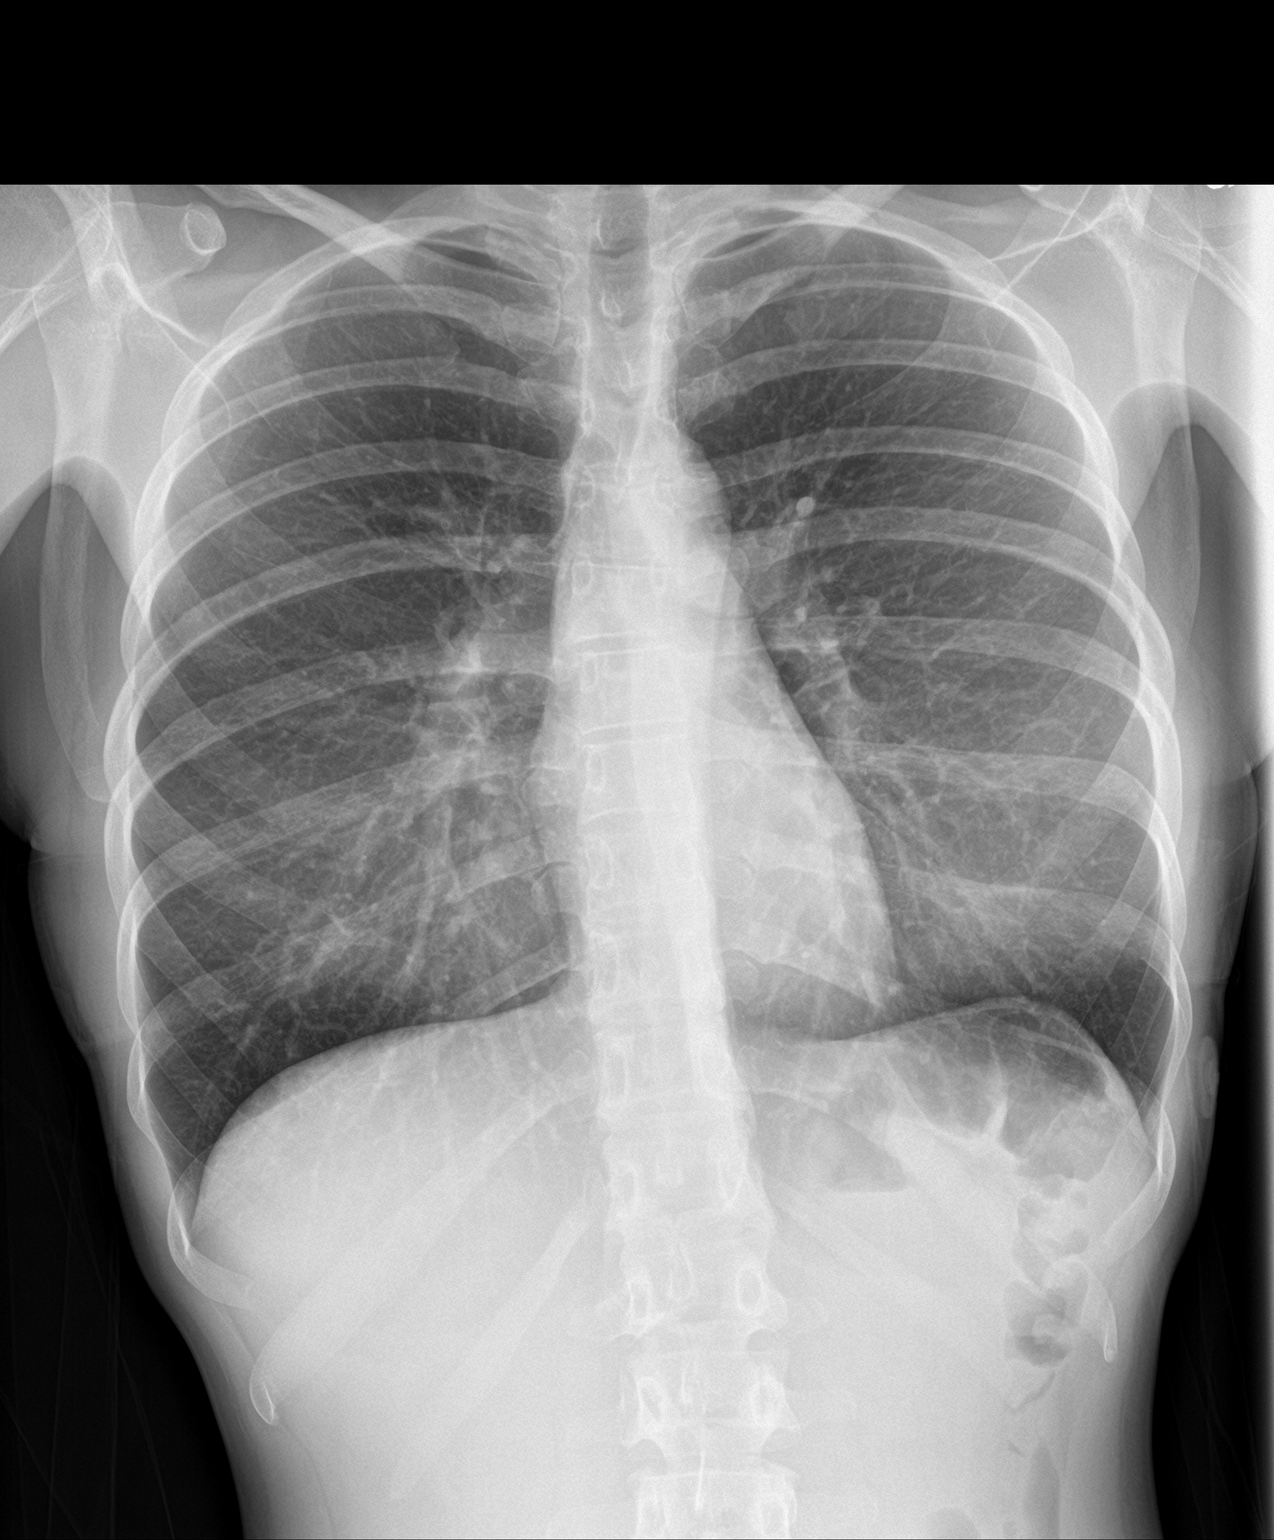

[chest lat]
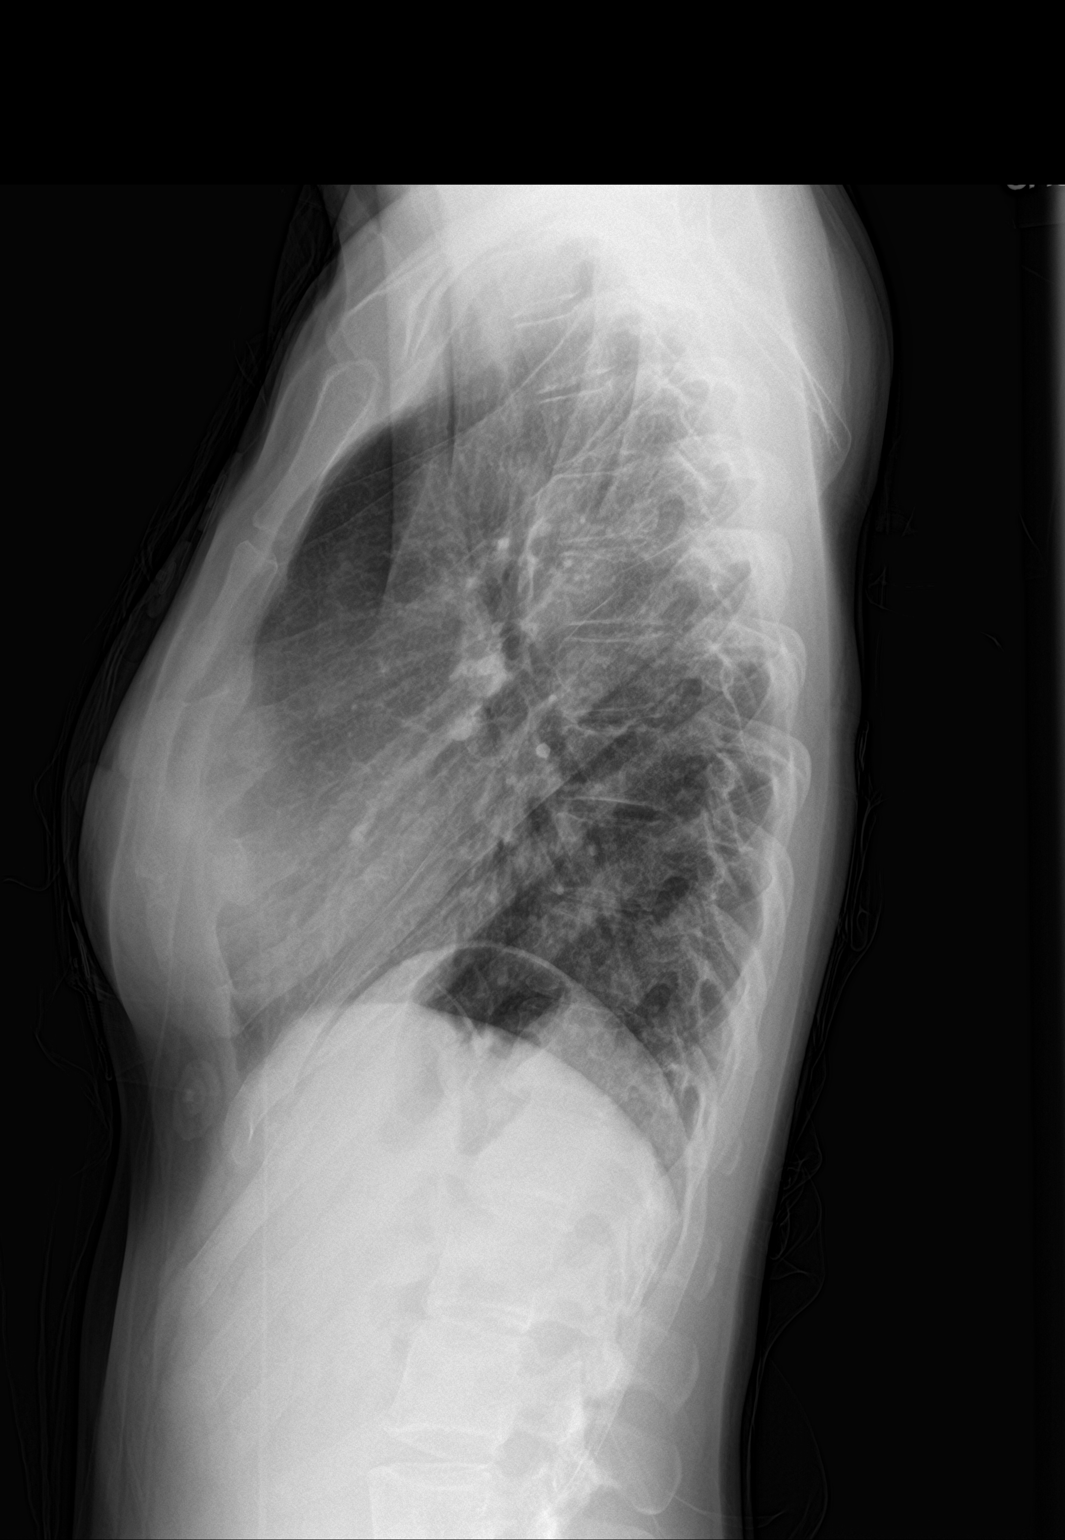

[2 of 2 positions shown; findings below may reference images not displayed]

FINDINGS: Cardiomediastinal silhouette is normal. Mediastinal contours appear
intact.

There is no evidence of focal airspace consolidation, pleural
effusion or pneumothorax.

Osseous structures are without acute abnormality. Soft tissues are
grossly normal.
IMPRESSION: No active cardiopulmonary disease.

## 2022-08-05 ENCOUNTER — Encounter: Payer: Self-pay | Admitting: Physician Assistant

## 2022-08-05 ENCOUNTER — Ambulatory Visit (INDEPENDENT_AMBULATORY_CARE_PROVIDER_SITE_OTHER): Payer: 59 | Admitting: Physician Assistant

## 2022-08-05 DIAGNOSIS — F319 Bipolar disorder, unspecified: Secondary | ICD-10-CM

## 2022-08-05 DIAGNOSIS — F4310 Post-traumatic stress disorder, unspecified: Secondary | ICD-10-CM

## 2022-08-05 DIAGNOSIS — F411 Generalized anxiety disorder: Secondary | ICD-10-CM | POA: Diagnosis not present

## 2022-08-05 DIAGNOSIS — F410 Panic disorder [episodic paroxysmal anxiety] without agoraphobia: Secondary | ICD-10-CM | POA: Diagnosis not present

## 2022-08-05 NOTE — Progress Notes (Signed)
[facility]  Patient ID: Angela Robinson,  MRN: [id number]  PCP: Patient, No Pcp Per   Date of Evaluation: 08/05/2022 time spent:20 minutes  Chief Complaint:  Chief Complaint   Anxiety; Depression; Insomnia; Follow-up    HISTORY/CURRENT STATUS: HPI  For routine med check.   Had a traumatic experience with childbirth on 07/18/2022. Had mild contractions for a few days, but cervix didn't ripen, then they broke her water, she went from 3 cm to 6 cm in a few mins, not enough time to even get the OB in there and she delivered within the hour. Lost a lot of blood within 24 hours, Was given transfusion and IV FE. She's been very tired but is getting better. Is being followed closely and may need more IV FE.   Mental health has been stable all throughout her pregnancy. And despite the serious complication in childbirth, doesn't feel depressed. Energy is coming back. Not crying easily.  Is on maternity leave, works at [employer].  No feelings of hopelessness.  Sleeps well, her dtr is sleeping around 4 hours until she wakes up for feeding. ADLs and personal hygiene are normal.   Denies any changes in concentration, making decisions, or remembering things.  Appetite has not changed.  Denies suicidal or homicidal thoughts.  Patient denies increased energy with decreased need for sleep, increased talkativeness, racing thoughts, impulsivity or risky behaviors, increased spending, increased libido, grandiosity, increased irritability or anger, paranoia, and no hallucinations.  Denies dizziness, syncope, seizures, numbness, tingling, tremor, tics, unsteady gait, slurred speech, confusion. Denies muscle or joint pain, stiffness, or dystonia. Denies unexplained weight loss, frequent infections, or sores that heal slowly.  No polyphagia, polydipsia, or polyuria. Denies visual changes or paresthesias.   Individual Medical History/ Review of Systems: Changes? :Yes      Had her baby last month.  She lost a lot of blood, had IV FE and transfusion. Baby is healthy.   Past medications for mental health diagnoses include: Depakote, Lamictal, Melatonin  Allergies: Tylenol [acetaminophen]  Current Medications:  Current Outpatient Medications:    hydrOXYzine (VISTARIL) 25 MG capsule, TAKE 1 CAPSULE (25 MG TOTAL) BY MOUTH EVERY 6 (SIX) HOURS AS NEEDED., Disp: 360 capsule, Rfl: 1   lamoTRIgine (LAMICTAL) 100 MG tablet, TAKE 1 TABLET BY MOUTH EVERY DAY, Disp: 90 tablet, Rfl: 0   QUEtiapine (SEROQUEL) 25 MG tablet, TAKE 1 TABLET BY MOUTH EVERY DAY AT NIGHT, Disp: 90 tablet, Rfl: 0   norgestimate-ethinyl estradiol (ORTHO-CYCLEN) 0.25-35 MG-MCG tablet, Take 1 tablet by mouth daily. (Patient not taking: Reported on 12/04/2021), Disp: 84 tablet, Rfl: 4 Medication Side Effects: none  Family Medical/ Social History: Changes?  no  MENTAL HEALTH EXAM:  There were no vitals taken for this visit.There is no height or weight on file to calculate BMI.  General Appearance: Casual, Neat and Well Groomed  Eye Contact:  Good  Speech:  Clear and Coherent and Normal Rate  Volume:  Normal  Mood:  Euthymic  Affect:  Appropriate  Thought Process:  Goal Directed and Descriptions of Associations: Circumstantial  Orientation:  Full (Time, Place, and Person)  Thought Content: Logical   Suicidal Thoughts:  No  Homicidal Thoughts:  No  Memory:  WNL  Judgement:  Good  Insight:  Good  Psychomotor Activity:  Normal  Concentration:  Concentration: Good and Attention Span: Good  Recall:  Good  Fund of Knowledge: Good  Language: Good  Assets:  Desire for Improvement Financial Resources/Insurance Housing Resilience Transportation Vocational/Educational  ADL's:  Intact  Cognition: WNL  Prognosis:  Good   DIAGNOSES:    ICD-10-CM   1. Bipolar depression  F31.9     2. PTSD (post-traumatic stress disorder)  F43.10     3. Generalized anxiety disorder  F41.1     4. Panic attacks  F41.0        Receiving Psychotherapy: No   RECOMMENDATIONS:  PDMP reviewed.  No controlled substances. I provided 20 minutes of face to face time during this encounter, including time spent before and after the visit in records review, medical decision making, counseling pertinent to today's visit, and charting.   Congrats on the birth of her daughter!    She'll call if she gets depressed at all and will increase the Lamictal. She's doing well right now so no change needed. Prior to her pregnancy, she was on Trileptal. Will restart it if needed.   Continue hydroxyzine 25 mg, 1 po q6h prn. Continue Lamictal 100 mg, 1 qd. Continue Seroquel 25 mg, 1 qhs prn.  Return in 3-4 weeks,   Melony Overlyeresa Darrek Leasure, PA-C

## 2022-09-01 ENCOUNTER — Other Ambulatory Visit: Payer: Self-pay | Admitting: Physician Assistant

## 2022-09-01 NOTE — Telephone Encounter (Signed)
Has appt. tomorrow

## 2022-09-02 ENCOUNTER — Telehealth (INDEPENDENT_AMBULATORY_CARE_PROVIDER_SITE_OTHER): Payer: 59 | Admitting: Physician Assistant

## 2022-09-02 ENCOUNTER — Encounter: Payer: Self-pay | Admitting: Physician Assistant

## 2022-09-02 DIAGNOSIS — F4310 Post-traumatic stress disorder, unspecified: Secondary | ICD-10-CM | POA: Diagnosis not present

## 2022-09-02 DIAGNOSIS — F319 Bipolar disorder, unspecified: Secondary | ICD-10-CM | POA: Diagnosis not present

## 2022-09-02 DIAGNOSIS — G4700 Insomnia, unspecified: Secondary | ICD-10-CM | POA: Diagnosis not present

## 2022-09-02 DIAGNOSIS — F411 Generalized anxiety disorder: Secondary | ICD-10-CM | POA: Diagnosis not present

## 2022-09-02 MED ORDER — LAMOTRIGINE 100 MG PO TABS: 100.0000 mg | ORAL_TABLET | Freq: Every day | ORAL | 3 refills | Status: DC

## 2022-09-02 MED ORDER — QUETIAPINE FUMARATE 25 MG PO TABS: ORAL_TABLET | ORAL | 3 refills | Status: DC

## 2022-09-02 NOTE — Progress Notes (Signed)
[facility]  Patient ID: Angela Robinson Age,  MRN: [id number]  PCP: Patient, No Pcp Per   Date of Evaluation: 09/02/2022 time spent:20 minutes  Chief Complaint:  Chief Complaint   Follow-up   Virtual Visit via Telehealth  I connected with patient by a video enabled telemedicine application with their informed consent, and verified patient privacy and that I am speaking with the correct person using two identifiers.  I am private, in my office and the patient is at home.  I discussed the limitations, risks, security and privacy concerns of performing an evaluation and management service by video and the availability of in person appointments. I also discussed with the patient that there may be a patient responsible charge related to this service. The patient expressed understanding and agreed to proceed.   I discussed the assessment and treatment plan with the patient. The patient was provided an opportunity to ask questions and all were answered. The patient agreed with the plan and demonstrated an understanding of the instructions.   The patient was advised to call back or seek an in-person evaluation if the symptoms worsen or if the condition fails to improve as anticipated.  I provided 20 minutes of non-face-to-face time during this encounter.  HISTORY/CURRENT STATUS: HPI  For routine med check.   Her dtr is 74 weeks old now, she's breast feeding. The pediatrician is aware of her psych meds and doesn't feel they need to be stopped.   Angela Robinson denies any symptoms of depression.  She is tired with having a newborn and a 4-year-old son but other than that she is doing well.  Able to enjoy things.  She tries to sleep when her kids do.  Takes Seroquel and hydroxyzine to help her sleep.  Most of the time she feels that she is getting adequate rest.  She is still out of work on maternity leave.  Hopes to go back soon though.  She works at [employer].  No extreme sadness,  tearfulness, or feelings of hopelessness.  ADLs and personal hygiene are normal.   Denies any changes in concentration, making decisions, or remembering things.  Appetite has not changed.  Weight is stable.  No anxiety reported.  Denies suicidal or homicidal thoughts.  Patient denies increased energy with decreased need for sleep, increased talkativeness, racing thoughts, impulsivity or risky behaviors, increased spending, increased libido, grandiosity, increased irritability or anger, paranoia, and no hallucinations.  Denies dizziness, syncope, seizures, numbness, tingling, tremor, tics, unsteady gait, slurred speech, confusion. Denies muscle or joint pain, stiffness, or dystonia. Denies unexplained weight loss, frequent infections, or sores that heal slowly.  No polyphagia, polydipsia, or polyuria. Denies visual changes or paresthesias.   Individual Medical History/ Review of Systems: Changes? :No       Past medications for mental health diagnoses include: Depakote, Lamictal, Melatonin  Allergies: Tylenol [acetaminophen]  Current Medications:  Current Outpatient Medications:    hydrOXYzine (VISTARIL) 25 MG capsule, TAKE 1 CAPSULE (25 MG TOTAL) BY MOUTH EVERY 6 (SIX) HOURS AS NEEDED., Disp: 360 capsule, Rfl: 1   lamoTRIgine (LAMICTAL) 100 MG tablet, Take 1 tablet (100 mg total) by mouth daily., Disp: 90 tablet, Rfl: 3   norgestimate-ethinyl estradiol (ORTHO-CYCLEN) 0.25-35 MG-MCG tablet, Take 1 tablet by mouth daily. (Patient not taking: Reported on 12/04/2021), Disp: 84 tablet, Rfl: 4   QUEtiapine (SEROQUEL) 25 MG tablet, TAKE 1 TABLET BY MOUTH EVERY DAY AT NIGHT, Disp: 90 tablet, Rfl: 3 Medication Side Effects: none  Family Medical/ Social History:  Changes?  no  MENTAL HEALTH EXAM:  currently breastfeeding.There is no height or weight on file to calculate BMI.  General Appearance: Casual, Neat and Well Groomed  Eye Contact:  Good  Speech:  Clear and Coherent and Normal Rate  Volume:   Normal  Mood:  Euthymic  Affect:  Appropriate  Thought Process:  Goal Directed and Descriptions of Associations: Circumstantial  Orientation:  Full (Time, Place, and Person)  Thought Content: Logical   Suicidal Thoughts:  No  Homicidal Thoughts:  No  Memory:  WNL  Judgement:  Good  Insight:  Good  Psychomotor Activity:  Normal  Concentration:  Concentration: Good and Attention Span: Good  Recall:  Good  Fund of Knowledge: Good  Language: Good  Assets:  Desire for Improvement Financial Resources/Insurance Housing Resilience Transportation  ADL's:  Intact  Cognition: WNL  Prognosis:  Good   DIAGNOSES:    ICD-10-CM   1. Bipolar I disorder (HCC)  F31.9     2. Generalized anxiety disorder  F41.1     3. PTSD (post-traumatic stress disorder)  F43.10     4. Insomnia, unspecified type  G47.00       Receiving Psychotherapy: No   RECOMMENDATIONS:  PDMP reviewed.  No controlled substances. I provided 20 minutes of non-face to face time during this encounter, including time spent before and after the visit in records review, medical decision making, counseling pertinent to today's visit, and charting.   She is doing well without any signs or symptoms of postpartum depression.  We will make no med changes at this time.  If manic symptoms recur then I will restart Trileptal.  Continue hydroxyzine 25 mg, 1 po q6h prn. Continue Lamictal 100 mg, 1 qd. Continue Seroquel 25 mg, 1 qhs prn.  Return in 4 weeks for close monitoring.  Melony Overly, PA-C

## 2023-01-18 ENCOUNTER — Other Ambulatory Visit: Payer: Self-pay | Admitting: Physician Assistant

## 2023-01-18 DIAGNOSIS — F411 Generalized anxiety disorder: Secondary | ICD-10-CM

## 2023-01-18 DIAGNOSIS — G4700 Insomnia, unspecified: Secondary | ICD-10-CM

## 2023-01-18 DIAGNOSIS — F410 Panic disorder [episodic paroxysmal anxiety] without agoraphobia: Secondary | ICD-10-CM

## 2023-07-19 ENCOUNTER — Telehealth: Payer: Self-pay | Admitting: Physician Assistant

## 2023-07-19 DIAGNOSIS — F410 Panic disorder [episodic paroxysmal anxiety] without agoraphobia: Secondary | ICD-10-CM

## 2023-07-19 DIAGNOSIS — F411 Generalized anxiety disorder: Secondary | ICD-10-CM

## 2023-07-19 DIAGNOSIS — G4700 Insomnia, unspecified: Secondary | ICD-10-CM

## 2023-07-19 NOTE — Telephone Encounter (Signed)
 Patient called in for refill on Hydroxyzine 25mg . She is down to her last couple pills. Ph: [phone] Pharmacy [facility] [address] [place]

## 2023-07-19 NOTE — Telephone Encounter (Signed)
 LF lorazepam 2/20 for 30 tablets

## 2023-07-19 NOTE — Telephone Encounter (Signed)
 Patient was last seen May 24 with RTC in 4 weeks. Needs an appt.

## 2023-07-20 ENCOUNTER — Telehealth: Payer: Self-pay | Admitting: Physician Assistant

## 2023-07-20 NOTE — Telephone Encounter (Signed)
 APT 5/14

## 2023-07-20 NOTE — Telephone Encounter (Signed)
 Pt has apt 5/14. Made payment and reported she is [redacted] weeks pregnant. Meds same as last pregnancy pt reported.

## 2023-07-20 NOTE — Telephone Encounter (Signed)
 RF had already been sent.

## 2023-07-20 NOTE — Telephone Encounter (Signed)
 Addressed in a different message.

## 2023-08-11 ENCOUNTER — Other Ambulatory Visit: Payer: Self-pay | Admitting: Physician Assistant

## 2023-08-11 DIAGNOSIS — G4700 Insomnia, unspecified: Secondary | ICD-10-CM

## 2023-08-11 DIAGNOSIS — F410 Panic disorder [episodic paroxysmal anxiety] without agoraphobia: Secondary | ICD-10-CM

## 2023-08-11 DIAGNOSIS — F411 Generalized anxiety disorder: Secondary | ICD-10-CM

## 2023-08-29 ENCOUNTER — Ambulatory Visit: Admitting: Physician Assistant

## 2023-08-31 ENCOUNTER — Telehealth: Payer: Self-pay | Admitting: Physician Assistant

## 2023-08-31 ENCOUNTER — Other Ambulatory Visit: Payer: Self-pay | Admitting: Physician Assistant

## 2023-08-31 DIAGNOSIS — G4700 Insomnia, unspecified: Secondary | ICD-10-CM

## 2023-08-31 DIAGNOSIS — F410 Panic disorder [episodic paroxysmal anxiety] without agoraphobia: Secondary | ICD-10-CM

## 2023-08-31 DIAGNOSIS — F411 Generalized anxiety disorder: Secondary | ICD-10-CM

## 2023-08-31 NOTE — Telephone Encounter (Signed)
 Pt called asking for a refill on her lamotrigine  100 mg and seroquel  25 mg. Pt has an appt 10/27/23. [facility] [address] [place] 

## 2023-09-01 MED ORDER — LAMOTRIGINE 100 MG PO TABS: 100.0000 mg | ORAL_TABLET | Freq: Every day | ORAL | 0 refills | Status: DC

## 2023-09-01 NOTE — Telephone Encounter (Signed)
 Addressed thru pharmacy interface.

## 2023-09-13 ENCOUNTER — Telehealth: Admitting: Physician Assistant

## 2023-10-11 ENCOUNTER — Other Ambulatory Visit: Payer: Self-pay | Admitting: Physician Assistant

## 2023-10-11 DIAGNOSIS — F410 Panic disorder [episodic paroxysmal anxiety] without agoraphobia: Secondary | ICD-10-CM

## 2023-10-11 DIAGNOSIS — F411 Generalized anxiety disorder: Secondary | ICD-10-CM

## 2023-10-11 DIAGNOSIS — G4700 Insomnia, unspecified: Secondary | ICD-10-CM

## 2023-10-27 ENCOUNTER — Encounter: Payer: Self-pay | Admitting: Physician Assistant

## 2023-10-27 ENCOUNTER — Ambulatory Visit (INDEPENDENT_AMBULATORY_CARE_PROVIDER_SITE_OTHER): Admitting: Physician Assistant

## 2023-10-27 DIAGNOSIS — F411 Generalized anxiety disorder: Secondary | ICD-10-CM

## 2023-10-27 DIAGNOSIS — G4700 Insomnia, unspecified: Secondary | ICD-10-CM | POA: Diagnosis not present

## 2023-10-27 DIAGNOSIS — F410 Panic disorder [episodic paroxysmal anxiety] without agoraphobia: Secondary | ICD-10-CM | POA: Diagnosis not present

## 2023-10-27 MED ORDER — QUETIAPINE FUMARATE 25 MG PO TABS: ORAL_TABLET | ORAL | 1 refills | Status: DC

## 2023-10-27 MED ORDER — LAMOTRIGINE 100 MG PO TABS: 100.0000 mg | ORAL_TABLET | Freq: Every day | ORAL | 1 refills | Status: DC

## 2023-10-27 MED ORDER — HYDROXYZINE PAMOATE 25 MG PO CAPS: 25.0000 mg | ORAL_CAPSULE | Freq: Three times a day (TID) | ORAL | 1 refills | Status: DC | PRN

## 2023-10-27 NOTE — Progress Notes (Unsigned)
 [facility]  Patient ID: Angela Robinson,  MRN: [id number]  PCP: Patient, No Pcp Per   Date of Evaluation: 10/27/2023 time spent:20 minutes  Chief Complaint:  Chief Complaint   Anxiety; Depression; Follow-up    HISTORY/CURRENT STATUS: HPI  Overdue for routine med check  Pregnant due 01/03/2024. Doing well on current meds. Still lives in [place], visiting her fm in [place] right now. Quit her job and staying home with the kids. Very happy!  Patient is able to enjoy things.  Energy and motivation are good.   No extreme sadness, tearfulness, or feelings of hopelessness.  Sleeps well. ADLs and personal hygiene are normal.   Denies any changes in concentration, making decisions, or remembering things. No mania, delirium, psychosis.  Denies suicidal or homicidal thoughts.  Denies dizziness, syncope, seizures, numbness, tingling, tremor, tics, unsteady gait, slurred speech, confusion. Denies muscle or joint pain, stiffness, or dystonia. Denies unexplained weight loss, frequent infections, or sores that heal slowly.  No polyphagia, polydipsia, or polyuria. Denies visual changes or paresthesias.   Individual Medical History/ Review of Systems: Changes? :No       Past medications for mental health diagnoses include: Depakote, Lamictal , Melatonin  Allergies: Tylenol [acetaminophen]  Current Medications:  Current Outpatient Medications:    hydrOXYzine  (VISTARIL ) 25 MG capsule, Take 1 capsule (25 mg total) by mouth every 8 (eight) hours as needed., Disp: 180 capsule, Rfl: 1   lamoTRIgine  (LAMICTAL ) 100 MG tablet, Take 1 tablet (100 mg total) by mouth daily., Disp: 90 tablet, Rfl: 1   norgestimate -ethinyl estradiol  (ORTHO-CYCLEN) 0.25-35 MG-MCG tablet, Take 1 tablet by mouth daily. (Patient not taking: Reported on 12/04/2021), Disp: 84 tablet, Rfl: 4   QUEtiapine  (SEROQUEL ) 25 MG tablet, TAKE 1 TABLET BY MOUTH EVERY DAY AT NIGHT, Disp: 90 tablet, Rfl: 1 Medication Side Effects:  none  Family Medical/ Social History: Changes?  no  MENTAL HEALTH EXAM:  currently breastfeeding.There is no height or weight on file to calculate BMI.  General Appearance: Casual, Neat, Well Groomed, and obviously pregnant  Eye Contact:  Good  Speech:  Clear and Coherent and Normal Rate  Volume:  Normal  Mood:  Euthymic  Affect:  Appropriate  Thought Process:  Goal Directed and Descriptions of Associations: Circumstantial  Orientation:  Full (Time, Place, and Person)  Thought Content: Logical   Suicidal Thoughts:  No  Homicidal Thoughts:  No  Memory:  WNL  Judgement:  Good  Insight:  Good  Psychomotor Activity:  Normal  Concentration:  Concentration: Good and Attention Span: Good  Recall:  Good  Fund of Knowledge: Good  Language: Good  Assets:  Desire for Improvement Financial Resources/Insurance Housing Resilience Transportation  ADL's:  Intact  Cognition: WNL  Prognosis:  Good   DIAGNOSES:    ICD-10-CM   1. Insomnia, unspecified type  G47.00 hydrOXYzine  (VISTARIL ) 25 MG capsule    2. Generalized anxiety disorder  F41.1 hydrOXYzine  (VISTARIL ) 25 MG capsule    3. Panic attacks  F41.0 hydrOXYzine  (VISTARIL ) 25 MG capsule     Receiving Psychotherapy: No   RECOMMENDATIONS:  PDMP reviewed.  No controlled substances. I provided 20  minutes of face to face time during this encounter, including time spent before and after the visit in records review, medical decision making, counseling pertinent to today's visit, and charting.   Congrats on her pregnancy!  She's doing well w/ current meds so no changes need to be made.  Again encouraged her to find a provider in [place], we cannot  do telehealth unless she's in [place]. Pt states they may move back to  in the next year or 2 and she doesn't want to change anything right now.   Continue hydroxyzine  25 mg, 1 po q6h prn. Continue Lamictal  100 mg, 1 qd. Continue Seroquel  25 mg, 1 qhs prn.  Return in 6 months.  Verneita Cooks,  PA-C

## 2024-03-11 ENCOUNTER — Other Ambulatory Visit: Payer: Self-pay | Admitting: Physician Assistant

## 2024-04-29 ENCOUNTER — Ambulatory Visit (INDEPENDENT_AMBULATORY_CARE_PROVIDER_SITE_OTHER): Admitting: Physician Assistant

## 2024-04-29 ENCOUNTER — Encounter: Payer: Self-pay | Admitting: Physician Assistant

## 2024-04-29 DIAGNOSIS — F410 Panic disorder [episodic paroxysmal anxiety] without agoraphobia: Secondary | ICD-10-CM

## 2024-04-29 DIAGNOSIS — F411 Generalized anxiety disorder: Secondary | ICD-10-CM

## 2024-04-29 DIAGNOSIS — G4700 Insomnia, unspecified: Secondary | ICD-10-CM

## 2024-04-29 DIAGNOSIS — F319 Bipolar disorder, unspecified: Secondary | ICD-10-CM | POA: Diagnosis not present

## 2024-04-29 MED ORDER — LAMOTRIGINE 100 MG PO TABS: 100.0000 mg | ORAL_TABLET | Freq: Every day | ORAL | 1 refills | Status: AC

## 2024-04-29 MED ORDER — QUETIAPINE FUMARATE 25 MG PO TABS: 25.0000 mg | ORAL_TABLET | Freq: Every day | ORAL | 1 refills | Status: AC

## 2024-04-29 MED ORDER — HYDROXYZINE PAMOATE 25 MG PO CAPS: 25.0000 mg | ORAL_CAPSULE | Freq: Three times a day (TID) | ORAL | 1 refills | Status: AC | PRN

## 2024-04-29 NOTE — Progress Notes (Signed)
 "     [facility]  Patient ID: Angela Robinson,  MRN: [id number]  PCP: Patient, No Pcp Per   Date of Evaluation: 04/29/2024 Time spent:20 minutes  Chief Complaint:  Chief Complaint   Anxiety; Depression; Insomnia; Follow-up    HISTORY/CURRENT STATUS: HPI  For 6 month med check. Her 28 month old son is with her.   Her son is 61 months old now. He's a good baby and they're both doing well.  She is breast feeding.   She is able to enjoy things.  Energy and motivation are pretty good.  Sleeps ok, her 56 month old is a good sleeper.  No extreme sadness, tearfulness, or feelings of hopelessness.  ADLs and personal hygiene are normal.   Denies any changes in concentration, making decisions, or remembering things.  Appetite has not changed.  Weight is stable.  She is not working now by choice.  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  No SI/HI.  No manic sx such as increased energy with decreased need for sleep, increased talkativeness, racing thoughts, impulsivity or risky behaviors, increased spending, increased libido, grandiosity, increased irritability or anger, paranoia, or hallucinations.  Individual Medical History/ Review of Systems: Changes? :Yes  had a baby in Sept      Past medications for mental health diagnoses include: Depakote, Lamictal , Melatonin  Allergies: Tylenol [acetaminophen]  Current Medications:  Current Outpatient Medications:    hydrOXYzine  (VISTARIL ) 25 MG capsule, Take 1 capsule (25 mg total) by mouth every 8 (eight) hours as needed., Disp: 180 capsule, Rfl: 1   lamoTRIgine  (LAMICTAL ) 100 MG tablet, Take 1 tablet (100 mg total) by mouth daily., Disp: 90 tablet, Rfl: 1   norgestimate -ethinyl estradiol  (ORTHO-CYCLEN) 0.25-35 MG-MCG tablet, Take 1 tablet by mouth daily. (Patient not taking: Reported on 04/29/2024), Disp: 84 tablet, Rfl: 4   QUEtiapine  (SEROQUEL ) 25 MG tablet, Take 1 tablet (25 mg total) by mouth at  bedtime., Disp: 90 tablet, Rfl: 1 Medication Side Effects: none  Family Medical/ Social History: Changes?  no  MENTAL HEALTH EXAM:  currently breastfeeding.There is no height or weight on file to calculate BMI.  General Appearance: Casual and Well Groomed  Eye Contact:  Good  Speech:  Clear and Coherent and Normal Rate  Volume:  Normal  Mood:  Euthymic  Affect:  Appropriate  Thought Process:  Goal Directed and Descriptions of Associations: Circumstantial  Orientation:  Full (Time, Place, and Person)  Thought Content: Logical   Suicidal Thoughts:  No  Homicidal Thoughts:  No  Memory:  WNL  Judgement:  Good  Insight:  Good  Psychomotor Activity:  Normal  Concentration:  Concentration: Good and Attention Span: Good  Recall:  Good  Fund of Knowledge: Good  Language: Good  Assets:  Communication Skills Desire for Improvement Financial Resources/Insurance Housing Resilience Transportation  ADL's:  Intact  Cognition: WNL  Prognosis:  Good   01/02/2024 (day her son was born)  Labs (she showed me the results on her MyChart account)  Glu 94 Alk phos 205  Other LFTs nl Kidney fxn nl  CBC nl  DIAGNOSES:    ICD-10-CM   1. Bipolar depression (HCC)  F31.9     2. Insomnia, unspecified type  G47.00 hydrOXYzine  (VISTARIL ) 25 MG capsule    3. Generalized anxiety disorder  F41.1 hydrOXYzine  (VISTARIL ) 25 MG capsule    4. Panic attacks  F41.0 hydrOXYzine  (VISTARIL ) 25 MG capsule     Receiving Psychotherapy: No  RECOMMENDATIONS:  PDMP reviewed.  No controlled substances. I provided approximately 20 minutes of face to face time during this encounter, including time spent before and after the visit in records review, medical decision making, counseling pertinent to today's visit, and charting.   Congratulations on the birth of her son!  She is doing well so no changes need to be made.  Continue hydroxyzine  25 mg, 1 po q6h prn. Continue Lamictal  100 mg, 1 qd. Continue  Seroquel  25 mg, 1 qhs prn.  Return in 6 months.  Verneita Cooks, PA-C  "

## 2024-10-15 ENCOUNTER — Ambulatory Visit (INDEPENDENT_AMBULATORY_CARE_PROVIDER_SITE_OTHER): Admitting: Physician Assistant
